# Patient Record
Sex: Female | Born: 1985
Health system: Southern US, Community
[De-identification: ages and names within clinical notes are randomized; demographics above are authoritative.]

## PROBLEM LIST (undated history)

## (undated) ENCOUNTER — Inpatient Hospital Stay (HOSPITAL_COMMUNITY): Admission: RE | Payer: BLUE CROSS/BLUE SHIELD | Source: Ambulatory Visit

## (undated) DIAGNOSIS — E039 Hypothyroidism, unspecified: Secondary | ICD-10-CM

## (undated) DIAGNOSIS — IMO0002 Reserved for concepts with insufficient information to code with codable children: Secondary | ICD-10-CM

## (undated) HISTORY — PX: WISDOM TOOTH EXTRACTION: SHX21

---

## 2010-01-24 ENCOUNTER — Encounter: Admission: RE | Admit: 2010-01-24 | Discharge: 2010-01-24 | Payer: Self-pay | Admitting: Obstetrics and Gynecology

## 2010-01-27 ENCOUNTER — Encounter: Admission: RE | Admit: 2010-01-27 | Discharge: 2010-01-27 | Payer: Self-pay | Admitting: General Surgery

## 2010-02-07 ENCOUNTER — Encounter: Admission: RE | Admit: 2010-02-07 | Discharge: 2010-02-07 | Payer: Self-pay | Admitting: General Surgery

## 2012-05-31 ENCOUNTER — Ambulatory Visit: Payer: Managed Care, Other (non HMO)

## 2012-05-31 ENCOUNTER — Ambulatory Visit (INDEPENDENT_AMBULATORY_CARE_PROVIDER_SITE_OTHER): Payer: Managed Care, Other (non HMO) | Admitting: Family Medicine

## 2012-05-31 VITALS — BP 98/64 | HR 73 | Temp 99.3°F | Resp 20 | Ht 66.0 in | Wt 158.0 lb

## 2012-05-31 DIAGNOSIS — H6090 Unspecified otitis externa, unspecified ear: Secondary | ICD-10-CM

## 2012-05-31 DIAGNOSIS — H9209 Otalgia, unspecified ear: Secondary | ICD-10-CM

## 2012-05-31 DIAGNOSIS — I889 Nonspecific lymphadenitis, unspecified: Secondary | ICD-10-CM

## 2012-05-31 DIAGNOSIS — H60399 Other infective otitis externa, unspecified ear: Secondary | ICD-10-CM

## 2012-05-31 MED ORDER — OFLOXACIN 0.3 % OT SOLN: 5.0000 [drp] | Freq: Every day | OTIC | Status: AC

## 2012-05-31 MED ORDER — AMOXICILLIN 875 MG PO TABS: 875.0000 mg | ORAL_TABLET | Freq: Two times a day (BID) | ORAL | Status: AC

## 2012-05-31 NOTE — Progress Notes (Signed)
Subjective: Patient has been swimming a lot this summer. This morning she awakened with left ear pain. She took some ibuprofen which helped, but it continues to bother her.  Objective: Right ears normal. Left ear canal is almost 2/3 swollen shut. I cannot get a good view of the TM. She has a tender node below the ear. Throat is clear.  Assessment: Otalgia Left otitis externa Lymphadenopathy  Plan: Amoxicillin 875 twice a day Floxin otic Return if further problems

## 2012-05-31 NOTE — Patient Instructions (Signed)
Otitis Externa  Otitis externa ("swimmer's ear") is a germ (bacterial) or fungal infection of the outer ear canal (from the eardrum to the outside of the ear). Swimming in dirty water may cause swimmer's ear. It also may be caused by moisture in the ear from water remaining after swimming or bathing. Often the first signs of infection may be itching in the ear canal. This may progress to ear canal swelling, redness, and pus drainage, which may be signs of infection.  HOME CARE INSTRUCTIONS    Apply the antibiotic drops to the ear canal as prescribed by your doctor.   This can be a very painful medical condition. A strong pain reliever may be prescribed.   Only take over-the-counter or prescription medicines for pain, discomfort, or fever as directed by your caregiver.   If your caregiver has given you a follow-up appointment, it is very important to keep that appointment. Not keeping the appointment could result in a chronic or permanent injury, pain, hearing loss and disability. If there is any problem keeping the appointment, you must call back to this facility for assistance.  PREVENTION    It is important to keep your ear dry. Use the corner of a towel to wick water out of the ear canal after swimming or bathing.   Avoid scratching in your ear. This can damage the ear canal or remove the protective wax lining the canal and make it easier for germs (bacteria) or a fungus to grow.   You may use ear drops made of rubbing alcohol and vinegar after swimming to prevent future "swimmer's ear" infections. Make up a small bottle of equal parts white vinegar and alcohol. Put 3 or 4 drops into each ear after swimming.   Avoid swimming in lakes, polluted water, or poorly chlorinated pools.  SEEK MEDICAL CARE IF:    An oral temperature above 102 F (38.9 C) develops.   Your ear is still painful after 3 days and shows signs of getting worse (redness, swelling, pain, or pus).  MAKE SURE YOU:    Understand these  instructions.   Will watch your condition.   Will get help right away if you are not doing well or get worse.  Document Released: 09/25/2005 Document Revised: 09/14/2011 Document Reviewed: 05/01/2008  [employer] Patient Information 2012 [employer].

## 2012-10-09 NOTE — L&D Delivery Note (Signed)
Operative Delivery Note At 7:07 PM a viable and healthy female was delivered via Vaginal, Spontaneous Delivery.  Presentation: vertex; Position: Left,, Occiput,, Anterior  Delivery of the head: 06/03/2013  7:07 PM  , Suprapubic Pressure McRoberts were performed simultaneously when the anterior shoulder did not immediately deliver  The infants head delivered and restituted to the left. The anterior shoulder did not immediately deliver and McRoberts and Suprapubic pressure were called for and the anterior shoulder released immediately after application of suprapubic pressure.  The Infants body was then eased out and the infant placed on the maternal abdomen. The infant was stimulated and the cord was clamped and cut and the infant was passed to the isolette. The placenta delivered spontaneously, intact, with 3V cord. A right labial laceration was repaired with 3-0 & 4-0 vicryl after infiltration with 1% lidocaine. A 1st degree perineal laceration was repaired with a figure-of-8 stitch. Mother and baby are doing well  APGAR: 6, 8; weight 8 lb 15 oz (4055 g).   Placenta status: Intact, Spontaneous.   Cord: 3 vessels   Anesthesia: Epidural  Episiotomy: None Lacerations:Right Labial & 1st degree vaginal Suture Repair: 3.0 vicryl Est. Blood Loss (mL): 300  Mom to postpartum.  Baby to nursery-stable.  Carla Le. 06/03/2013, 7:37 PM

## 2012-11-01 LAB — OB RESULTS CONSOLE RUBELLA ANTIBODY, IGM: Rubella: IMMUNE

## 2012-11-01 LAB — OB RESULTS CONSOLE HIV ANTIBODY (ROUTINE TESTING): HIV: NONREACTIVE

## 2012-11-01 LAB — OB RESULTS CONSOLE ABO/RH

## 2012-11-01 LAB — OB RESULTS CONSOLE RPR: RPR: NONREACTIVE

## 2012-11-04 LAB — OB RESULTS CONSOLE GC/CHLAMYDIA: Gonorrhea: NEGATIVE

## 2013-06-03 ENCOUNTER — Inpatient Hospital Stay (HOSPITAL_COMMUNITY)
Admission: AD | Admit: 2013-06-03 | Discharge: 2013-06-05 | DRG: 373 | Disposition: A | Discharge status: Home / Self Care | Payer: BC Managed Care – PPO | Source: Ambulatory Visit | Attending: Obstetrics and Gynecology | Admitting: Obstetrics and Gynecology

## 2013-06-03 ENCOUNTER — Encounter (HOSPITAL_COMMUNITY): Payer: Self-pay | Admitting: Anesthesiology

## 2013-06-03 ENCOUNTER — Inpatient Hospital Stay (HOSPITAL_COMMUNITY): Payer: BC Managed Care – PPO | Admitting: Anesthesiology

## 2013-06-03 ENCOUNTER — Encounter (HOSPITAL_COMMUNITY): Payer: Self-pay | Admitting: *Deleted

## 2013-06-03 DIAGNOSIS — E079 Disorder of thyroid, unspecified: Secondary | ICD-10-CM | POA: Diagnosis present

## 2013-06-03 DIAGNOSIS — E039 Hypothyroidism, unspecified: Secondary | ICD-10-CM | POA: Diagnosis present

## 2013-06-03 DIAGNOSIS — O99892 Other specified diseases and conditions complicating childbirth: Secondary | ICD-10-CM | POA: Diagnosis present

## 2013-06-03 DIAGNOSIS — O360990 Maternal care for other rhesus isoimmunization, unspecified trimester, not applicable or unspecified: Secondary | ICD-10-CM | POA: Diagnosis present

## 2013-06-03 DIAGNOSIS — Z22330 Carrier of Group B streptococcus: Secondary | ICD-10-CM

## 2013-06-03 HISTORY — DX: Reserved for concepts with insufficient information to code with codable children: IMO0002

## 2013-06-03 HISTORY — DX: Hypothyroidism, unspecified: E03.9

## 2013-06-03 LAB — CBC
HCT: 37.1 % (ref 36.0–46.0)
Hemoglobin: 13.2 g/dL (ref 12.0–15.0)
MCH: 31.7 pg (ref 26.0–34.0)
MCHC: 35.6 g/dL (ref 30.0–36.0)
MCV: 89.2 fL (ref 78.0–100.0)
Platelets: 186 K/uL (ref 150–400)
RBC: 4.16 MIL/uL (ref 3.87–5.11)
RDW: 13.3 % (ref 11.5–15.5)
WBC: 14.2 K/uL — ABNORMAL HIGH (ref 4.0–10.5)

## 2013-06-03 LAB — SYPHILIS: RPR W/REFLEX TO RPR TITER AND TREPONEMAL ANTIBODIES, TRADITIONAL SCREENING AND DIAGNOSIS ALGORITHM: RPR Ser Ql: NONREACTIVE

## 2013-06-03 MED ORDER — ONDANSETRON HCL 4 MG/2ML IJ SOLN: 4.0000 mg | Freq: Four times a day (QID) | INTRAMUSCULAR | Status: DC | PRN

## 2013-06-03 MED ORDER — PENICILLIN G POTASSIUM 5000000 UNITS IJ SOLR
5.0000 10*6.[IU] | Freq: Once | INTRAMUSCULAR | Status: AC
  Administered 2013-06-03: 5 10*6.[IU] via INTRAVENOUS
  Filled 2013-06-03: qty 5

## 2013-06-03 MED ORDER — LACTATED RINGERS IV SOLN
INTRAVENOUS | Status: DC
  Administered 2013-06-03 (×3): via INTRAVENOUS

## 2013-06-03 MED ORDER — TERBUTALINE SULFATE 1 MG/ML IJ SOLN: 0.2500 mg | Freq: Once | INTRAMUSCULAR | Status: DC | PRN

## 2013-06-03 MED ORDER — OXYCODONE-ACETAMINOPHEN 5-325 MG PO TABS: 1.0000 | ORAL_TABLET | ORAL | Status: DC | PRN

## 2013-06-03 MED ORDER — OXYTOCIN 40 UNITS IN LACTATED RINGERS INFUSION - SIMPLE MED: 62.5000 mL/h | INTRAVENOUS | Status: DC

## 2013-06-03 MED ORDER — OXYTOCIN BOLUS FROM INFUSION: 500.0000 mL | INTRAVENOUS | Status: DC

## 2013-06-03 MED ORDER — METHYLERGONOVINE MALEATE 0.2 MG PO TABS: 0.2000 mg | ORAL_TABLET | ORAL | Status: DC | PRN

## 2013-06-03 MED ORDER — BENZOCAINE-MENTHOL 20-0.5 % EX AERO
1.0000 "application " | INHALATION_SPRAY | CUTANEOUS | Status: DC | PRN
  Administered 2013-06-04: 1 via TOPICAL
  Filled 2013-06-03: qty 56

## 2013-06-03 MED ORDER — SIMETHICONE 80 MG PO CHEW: 80.0000 mg | CHEWABLE_TABLET | ORAL | Status: DC | PRN

## 2013-06-03 MED ORDER — DIPHENHYDRAMINE HCL 25 MG PO CAPS: 25.0000 mg | ORAL_CAPSULE | Freq: Four times a day (QID) | ORAL | Status: DC | PRN

## 2013-06-03 MED ORDER — EPHEDRINE 5 MG/ML INJ
10.0000 mg | INTRAVENOUS | Status: DC | PRN
  Filled 2013-06-03: qty 2

## 2013-06-03 MED ORDER — EPHEDRINE 5 MG/ML INJ
10.0000 mg | INTRAVENOUS | Status: DC | PRN
  Filled 2013-06-03: qty 4
  Filled 2013-06-03: qty 2

## 2013-06-03 MED ORDER — TETANUS-DIPHTH-ACELL PERTUSSIS 5-2.5-18.5 LF-MCG/0.5 IM SUSP
0.5000 mL | Freq: Once | INTRAMUSCULAR | Status: AC
  Administered 2013-06-04: 0.5 mL via INTRAMUSCULAR

## 2013-06-03 MED ORDER — METHYLERGONOVINE MALEATE 0.2 MG/ML IJ SOLN: 0.2000 mg | INTRAMUSCULAR | Status: DC | PRN

## 2013-06-03 MED ORDER — ONDANSETRON HCL 4 MG/2ML IJ SOLN: 4.0000 mg | INTRAMUSCULAR | Status: DC | PRN

## 2013-06-03 MED ORDER — IBUPROFEN 600 MG PO TABS: 600.0000 mg | ORAL_TABLET | Freq: Four times a day (QID) | ORAL | Status: DC | PRN

## 2013-06-03 MED ORDER — PENICILLIN G POTASSIUM 5000000 UNITS IJ SOLR
2.5000 10*6.[IU] | INTRAVENOUS | Status: DC
  Administered 2013-06-03 (×3): 2.5 10*6.[IU] via INTRAVENOUS
  Filled 2013-06-03 (×6): qty 2.5

## 2013-06-03 MED ORDER — DIBUCAINE 1 % RE OINT: 1.0000 "application " | TOPICAL_OINTMENT | RECTAL | Status: DC | PRN

## 2013-06-03 MED ORDER — SENNOSIDES-DOCUSATE SODIUM 8.6-50 MG PO TABS
2.0000 | ORAL_TABLET | Freq: Every day | ORAL | Status: DC
  Administered 2013-06-03 – 2013-06-04 (×2): 2 via ORAL

## 2013-06-03 MED ORDER — LEVOTHYROXINE SODIUM 75 MCG PO TABS
75.0000 ug | ORAL_TABLET | Freq: Every day | ORAL | Status: DC
  Administered 2013-06-04 – 2013-06-05 (×2): 75 ug via ORAL
  Filled 2013-06-03 (×4): qty 1

## 2013-06-03 MED ORDER — ACETAMINOPHEN 325 MG PO TABS: 650.0000 mg | ORAL_TABLET | ORAL | Status: DC | PRN

## 2013-06-03 MED ORDER — LANOLIN HYDROUS EX OINT: TOPICAL_OINTMENT | CUTANEOUS | Status: DC | PRN

## 2013-06-03 MED ORDER — LEVOTHYROXINE SODIUM 25 MCG PO TABS
25.0000 ug | ORAL_TABLET | Freq: Every day | ORAL | Status: DC
  Filled 2013-06-03: qty 1

## 2013-06-03 MED ORDER — DIPHENHYDRAMINE HCL 50 MG/ML IJ SOLN: 12.5000 mg | INTRAMUSCULAR | Status: DC | PRN

## 2013-06-03 MED ORDER — PHENYLEPHRINE 40 MCG/ML (10ML) SYRINGE FOR IV PUSH (FOR BLOOD PRESSURE SUPPORT)
80.0000 ug | PREFILLED_SYRINGE | INTRAVENOUS | Status: DC | PRN
  Filled 2013-06-03: qty 2
  Filled 2013-06-03: qty 5

## 2013-06-03 MED ORDER — LACTATED RINGERS IV SOLN: 500.0000 mL | INTRAVENOUS | Status: DC | PRN

## 2013-06-03 MED ORDER — ONDANSETRON HCL 4 MG PO TABS: 4.0000 mg | ORAL_TABLET | ORAL | Status: DC | PRN

## 2013-06-03 MED ORDER — FENTANYL 2.5 MCG/ML BUPIVACAINE 1/10 % EPIDURAL INFUSION (WH - ANES): INTRAMUSCULAR | Status: DC | PRN

## 2013-06-03 MED ORDER — FENTANYL 2.5 MCG/ML BUPIVACAINE 1/10 % EPIDURAL INFUSION (WH - ANES)
14.0000 mL/h | INTRAMUSCULAR | Status: DC | PRN
  Administered 2013-06-03: 14 mL/h via EPIDURAL
  Filled 2013-06-03 (×2): qty 125

## 2013-06-03 MED ORDER — FLEET ENEMA 7-19 GM/118ML RE ENEM: 1.0000 | ENEMA | RECTAL | Status: DC | PRN

## 2013-06-03 MED ORDER — FENTANYL 2.5 MCG/ML BUPIVACAINE 1/10 % EPIDURAL INFUSION (WH - ANES)
INTRAMUSCULAR | Status: DC | PRN
  Administered 2013-06-03: 14 mL/h via EPIDURAL

## 2013-06-03 MED ORDER — OXYTOCIN 40 UNITS IN LACTATED RINGERS INFUSION - SIMPLE MED
1.0000 m[IU]/min | INTRAVENOUS | Status: DC
  Administered 2013-06-03: 2 m[IU]/min via INTRAVENOUS
  Filled 2013-06-03: qty 1000

## 2013-06-03 MED ORDER — PHENYLEPHRINE 40 MCG/ML (10ML) SYRINGE FOR IV PUSH (FOR BLOOD PRESSURE SUPPORT)
80.0000 ug | PREFILLED_SYRINGE | INTRAVENOUS | Status: DC | PRN
  Filled 2013-06-03: qty 2

## 2013-06-03 MED ORDER — LIDOCAINE HCL (PF) 1 % IJ SOLN
INTRAMUSCULAR | Status: DC | PRN
  Administered 2013-06-03 (×2): 9 mL

## 2013-06-03 MED ORDER — LIDOCAINE HCL (PF) 1 % IJ SOLN
30.0000 mL | INTRAMUSCULAR | Status: DC | PRN
  Administered 2013-06-03: 30 mL via SUBCUTANEOUS
  Filled 2013-06-03 (×2): qty 30

## 2013-06-03 MED ORDER — WITCH HAZEL-GLYCERIN EX PADS: 1.0000 "application " | MEDICATED_PAD | CUTANEOUS | Status: DC | PRN

## 2013-06-03 MED ORDER — ZOLPIDEM TARTRATE 5 MG PO TABS: 5.0000 mg | ORAL_TABLET | Freq: Every evening | ORAL | Status: DC | PRN

## 2013-06-03 MED ORDER — PRENATAL MULTIVITAMIN CH
1.0000 | ORAL_TABLET | Freq: Every day | ORAL | Status: DC
  Administered 2013-06-04 – 2013-06-05 (×2): 1 via ORAL
  Filled 2013-06-03 (×2): qty 1

## 2013-06-03 MED ORDER — CITRIC ACID-SODIUM CITRATE 334-500 MG/5ML PO SOLN: 30.0000 mL | ORAL | Status: DC | PRN

## 2013-06-03 MED ORDER — IBUPROFEN 600 MG PO TABS
600.0000 mg | ORAL_TABLET | Freq: Four times a day (QID) | ORAL | Status: DC
  Administered 2013-06-04 – 2013-06-05 (×7): 600 mg via ORAL
  Filled 2013-06-03 (×7): qty 1

## 2013-06-03 MED ORDER — LACTATED RINGERS IV SOLN: 500.0000 mL | Freq: Once | INTRAVENOUS | Status: DC

## 2013-06-03 NOTE — Anesthesia Procedure Notes (Signed)
Epidural Patient location during procedure: OB Start time: 06/03/2013 9:10 AM End time: 06/03/2013 9:14 AM  Staffing Anesthesiologist: Sandrea Hughs Performed by: anesthesiologist   Preanesthetic Checklist Completed: patient identified, surgical consent, pre-op evaluation, timeout performed, IV checked, risks and benefits discussed and monitors and equipment checked  Epidural Patient position: sitting Prep: site prepped and draped and DuraPrep Patient monitoring: continuous pulse ox and blood pressure Approach: midline Injection technique: LOR air  Needle:  Needle type: Tuohy  Needle gauge: 17 G Needle length: 9 cm and 9 Needle insertion depth: 6 cm Catheter type: closed end flexible Catheter size: 19 Gauge Catheter at skin depth: 11 cm Test dose: negative and Other  Assessment Sensory level: T9 Events: blood not aspirated, injection not painful, no injection resistance, negative IV test and no paresthesia  Additional Notes Reason for block:procedure for pain

## 2013-06-03 NOTE — Anesthesia Preprocedure Evaluation (Signed)
Anesthesia Evaluation  Patient identified by MRN, date of birth, ID band Patient awake    Reviewed: Allergy & Precautions, H&P , NPO status , Patient's Chart, lab work & pertinent test results  Airway Mallampati: I TM Distance: >3 FB Neck ROM: full    Dental no notable dental hx.    Pulmonary neg pulmonary ROS,    Pulmonary exam normal       Cardiovascular negative cardio ROS      Neuro/Psych negative neurological ROS  negative psych ROS   GI/Hepatic negative GI ROS, Neg liver ROS,   Endo/Other    Renal/GU negative Renal ROS  negative genitourinary   Musculoskeletal negative musculoskeletal ROS (+)   Abdominal Normal abdominal exam  (+)   Peds  Hematology negative hematology ROS (+)   Anesthesia Other Findings   Reproductive/Obstetrics (+) Pregnancy                           Anesthesia Physical Anesthesia Plan  ASA: II  Anesthesia Plan: Epidural   Post-op Pain Management:    Induction:   Airway Management Planned:   Additional Equipment:   Intra-op Plan:   Post-operative Plan:   Informed Consent: I have reviewed the patients History and Physical, chart, labs and discussed the procedure including the risks, benefits and alternatives for the proposed anesthesia with the patient or authorized representative who has indicated his/her understanding and acceptance.     Plan Discussed with:   Anesthesia Plan Comments:         Anesthesia Quick Evaluation

## 2013-06-03 NOTE — H&P (Signed)
Carla Le is a 27 y.o. female presenting for labor  27yo G2P0010 @ 39+1 presents c/o contractions and was admitted for labor. Her pregnancy has been uncomplicated to this point. She is Rh negative and received Rhogam @ 28 weeks. She is GBS +. She has a h/o hypothyroidism and is on low dose synthroid History OB History   Grav Para Term Preterm Abortions TAB SAB Ect Mult Living   2    1  1         Past Medical History  Diagnosis Date  . Hypothyroidism   . Abnormal Pap smear    Past Surgical History  Procedure Laterality Date  . Wisdom tooth extraction     Family History: family history is not on file. Social History:  reports that she has never smoked. She has never used smokeless tobacco. She reports that she does not drink alcohol or use illicit drugs.   Prenatal Transfer Tool  Maternal Diabetes: No Genetic Screening: Declined Maternal Ultrasounds/Referrals: Normal Fetal Ultrasounds or other Referrals:  None Maternal Substance Abuse:  No Significant Maternal Medications:  Meds include: Syntroid Significant Maternal Lab Results:  None Other Comments:  None  ROS: as above  Dilation: 10 Effacement (%): 100 Station: +1 Exam by:: k fields, rn Blood pressure 120/73, pulse 113, temperature 99.6 F (37.6 C), temperature source Axillary, resp. rate 20, height 5\' 6"  (1.676 m), weight 88.905 kg (196 lb), SpO2 99.00%. Exam Physical Exam  Prenatal labs: ABO, Rh: A/Negative/-- (01/24 0000) Antibody: Negative (01/24 0000) Rubella: Immune (01/24 0000) RPR: NON REACTIVE (08/26 0329)  HBsAg: Negative (01/24 0000)  HIV: Non-reactive (01/24 0000)  GBS:   Positive  Assessment/Plan: 1) Admit 2) PCN 3) Epidural on request   Wilena Tyndall H. 06/03/2013, 7:54 PM

## 2013-06-03 NOTE — Progress Notes (Signed)
Desires Circ

## 2013-06-03 NOTE — MAU Note (Signed)
Contractions x 3 days, worsening over the last 2 hours.

## 2013-06-04 LAB — CBC
HCT: 31.5 % — ABNORMAL LOW (ref 36.0–46.0)
Hemoglobin: 11 g/dL — ABNORMAL LOW (ref 12.0–15.0)
MCH: 31.7 pg (ref 26.0–34.0)
MCHC: 34.9 g/dL (ref 30.0–36.0)
RBC: 3.47 MIL/uL — ABNORMAL LOW (ref 3.87–5.11)

## 2013-06-04 NOTE — Progress Notes (Signed)
PPD#1 Pt without complaints. Lochia- mild.  VSSAF,  WBC- 24.3 IMP/ Stable Plan/ Routine care. circ today.

## 2013-06-04 NOTE — Anesthesia Postprocedure Evaluation (Signed)
Anesthesia Post Note  Patient: Carla Le  Procedure(s) Performed: * No procedures listed *  Anesthesia type: Epidural  Patient location: Mother/Baby  Post pain: Pain level controlled  Post assessment: Post-op Vital signs reviewed  Last Vitals:  Filed Vitals:   06/04/13 1748  BP: 88/56  Pulse: 81  Temp: 36.6 C  Resp:     Post vital signs: Reviewed  Level of consciousness: awake  Complications: No apparent anesthesia complications

## 2013-06-05 NOTE — Discharge Summary (Signed)
Obstetric Discharge Summary Reason for Admission: onset of labor Prenatal Procedures: none Intrapartum Procedures: spontaneous vaginal delivery Postpartum Procedures: none Complications-Operative and Postpartum: first degree labial tears Hemoglobin  Date Value Range Status  06/04/2013 11.0* 12.0 - 15.0 g/dL Final     HCT  Date Value Range Status  06/04/2013 31.5* 36.0 - 46.0 % Final    Discharge Diagnoses: Term Pregnancy-delivered  Discharge Information: Date: 06/05/2013 Activity: pelvic rest Diet: routine Medications: Ibuprofen Condition: stable Instructions: refer to practice specific booklet Discharge to: home Follow-up Information   Follow up with Almon Hercules., MD In 4 weeks.   Specialty:  Obstetrics and Gynecology   Contact information:   [address] [place] [address] [phone]       Newborn Data: Live born female  Birth Weight: 8 lb 15 oz (4055 g) APGAR: 6, 8  Home with mother.  Edwards Mckelvie A 06/05/2013, 7:04 AM

## 2013-06-05 NOTE — Progress Notes (Signed)
Baby reportedly has a broken clavicle and has had high respirations because of it.  Circ not being done in hospital.

## 2013-06-05 NOTE — Progress Notes (Signed)
Patient is eating, ambulating, voiding.  Pain control is good.  Filed Vitals:   06/04/13 0245 06/04/13 0555 06/04/13 1748 06/05/13 0529  BP: 103/67 109/74 88/56 101/60  Pulse: 111 81 81 74  Temp:  97.6 F (36.4 C) 97.9 F (36.6 C) 97.4 F (36.3 C)  TempSrc: Oral Oral Oral Oral  Resp: 18 18  17   Height:      Weight:      SpO2:  97% 99%     Fundus firm Perineum without swelling.  Lab Results  Component Value Date   WBC 23.4* 06/04/2013   HGB 11.0* 06/04/2013   HCT 31.5* 06/04/2013   MCV 90.8 06/04/2013   PLT 178 06/04/2013    A/Negative/-- (01/24 0000)/RI  A/P Post partum day 2.  Routine care.  Expect d/c today.  Baby Rh negative- no Rhogam.  Kyisha Fowle A

## 2014-08-10 ENCOUNTER — Encounter (HOSPITAL_COMMUNITY): Payer: Self-pay | Admitting: *Deleted

## 2015-06-06 ENCOUNTER — Encounter (HOSPITAL_BASED_OUTPATIENT_CLINIC_OR_DEPARTMENT_OTHER): Payer: Self-pay | Admitting: *Deleted

## 2015-06-06 ENCOUNTER — Emergency Department (HOSPITAL_BASED_OUTPATIENT_CLINIC_OR_DEPARTMENT_OTHER)
Admission: EM | Admit: 2015-06-06 | Discharge: 2015-06-06 | Disposition: A | Discharge status: Home / Self Care | Payer: BLUE CROSS/BLUE SHIELD | Attending: Emergency Medicine | Admitting: Emergency Medicine

## 2015-06-06 ENCOUNTER — Emergency Department (HOSPITAL_BASED_OUTPATIENT_CLINIC_OR_DEPARTMENT_OTHER): Payer: BLUE CROSS/BLUE SHIELD

## 2015-06-06 DIAGNOSIS — Z79899 Other long term (current) drug therapy: Secondary | ICD-10-CM | POA: Diagnosis not present

## 2015-06-06 DIAGNOSIS — R509 Fever, unspecified: Secondary | ICD-10-CM | POA: Insufficient documentation

## 2015-06-06 DIAGNOSIS — E039 Hypothyroidism, unspecified: Secondary | ICD-10-CM | POA: Diagnosis not present

## 2015-06-06 DIAGNOSIS — R109 Unspecified abdominal pain: Secondary | ICD-10-CM | POA: Diagnosis not present

## 2015-06-06 DIAGNOSIS — Z3202 Encounter for pregnancy test, result negative: Secondary | ICD-10-CM | POA: Insufficient documentation

## 2015-06-06 DIAGNOSIS — M546 Pain in thoracic spine: Secondary | ICD-10-CM | POA: Diagnosis present

## 2015-06-06 LAB — CBC WITH DIFFERENTIAL/PLATELET
Basophils Absolute: 0 10*3/uL (ref 0.0–0.1)
Basophils Relative: 0 % (ref 0–1)
EOS PCT: 0 % (ref 0–5)
Eosinophils Absolute: 0 10*3/uL (ref 0.0–0.7)
HCT: 37.7 % (ref 36.0–46.0)
Hemoglobin: 13 g/dL (ref 12.0–15.0)
LYMPHS ABS: 0.7 10*3/uL (ref 0.7–4.0)
LYMPHS PCT: 6 % — AB (ref 12–46)
MCH: 30.7 pg (ref 26.0–34.0)
MCHC: 34.5 g/dL (ref 30.0–36.0)
MCV: 89.1 fL (ref 78.0–100.0)
MONO ABS: 1.6 10*3/uL — AB (ref 0.1–1.0)
Monocytes Relative: 13 % — ABNORMAL HIGH (ref 3–12)
Neutro Abs: 10.2 10*3/uL — ABNORMAL HIGH (ref 1.7–7.7)
Neutrophils Relative %: 81 % — ABNORMAL HIGH (ref 43–77)
PLATELETS: 185 10*3/uL (ref 150–400)
RBC: 4.23 MIL/uL (ref 3.87–5.11)
RDW: 12.6 % (ref 11.5–15.5)
WBC: 12.5 10*3/uL — AB (ref 4.0–10.5)

## 2015-06-06 LAB — COMPREHENSIVE METABOLIC PANEL
ALT: 10 U/L — AB (ref 14–54)
AST: 16 U/L (ref 15–41)
Albumin: 3.6 g/dL (ref 3.5–5.0)
Alkaline Phosphatase: 46 U/L (ref 38–126)
Anion gap: 8 (ref 5–15)
BILIRUBIN TOTAL: 0.7 mg/dL (ref 0.3–1.2)
BUN: 10 mg/dL (ref 6–20)
CALCIUM: 8.7 mg/dL — AB (ref 8.9–10.3)
CHLORIDE: 105 mmol/L (ref 101–111)
CO2: 23 mmol/L (ref 22–32)
CREATININE: 0.81 mg/dL (ref 0.44–1.00)
Glucose, Bld: 109 mg/dL — ABNORMAL HIGH (ref 65–99)
Potassium: 3.4 mmol/L — ABNORMAL LOW (ref 3.5–5.1)
Sodium: 136 mmol/L (ref 135–145)
TOTAL PROTEIN: 6.7 g/dL (ref 6.5–8.1)

## 2015-06-06 LAB — URINALYSIS, ROUTINE W REFLEX MICROSCOPIC
Bilirubin Urine: NEGATIVE
GLUCOSE, UA: NEGATIVE mg/dL
HGB URINE DIPSTICK: NEGATIVE
Ketones, ur: NEGATIVE mg/dL
Leukocytes, UA: NEGATIVE
Nitrite: NEGATIVE
Protein, ur: NEGATIVE mg/dL
SPECIFIC GRAVITY, URINE: 1.012 (ref 1.005–1.030)
Urobilinogen, UA: 1 mg/dL (ref 0.0–1.0)
pH: 7 (ref 5.0–8.0)

## 2015-06-06 LAB — LIPASE, BLOOD: LIPASE: 17 U/L — AB (ref 22–51)

## 2015-06-06 LAB — PREGNANCY, URINE: Preg Test, Ur: NEGATIVE

## 2015-06-06 MED ORDER — FENTANYL CITRATE (PF) 100 MCG/2ML IJ SOLN
100.0000 ug | Freq: Once | INTRAMUSCULAR | Status: AC
  Administered 2015-06-06: 100 ug via INTRAVENOUS
  Filled 2015-06-06: qty 2

## 2015-06-06 MED ORDER — IOHEXOL 300 MG/ML  SOLN
100.0000 mL | Freq: Once | INTRAMUSCULAR | Status: AC | PRN
  Administered 2015-06-06: 100 mL via INTRAVENOUS

## 2015-06-06 MED ORDER — SODIUM CHLORIDE 0.9 % IV SOLN
INTRAVENOUS | Status: DC
  Administered 2015-06-06: 02:00:00 via INTRAVENOUS

## 2015-06-06 MED ORDER — IOHEXOL 300 MG/ML  SOLN
25.0000 mL | Freq: Once | INTRAMUSCULAR | Status: AC | PRN
  Administered 2015-06-06: 25 mL via ORAL

## 2015-06-06 MED ORDER — ONDANSETRON HCL 4 MG/2ML IJ SOLN
4.0000 mg | Freq: Once | INTRAMUSCULAR | Status: AC
  Administered 2015-06-06: 4 mg via INTRAVENOUS
  Filled 2015-06-06: qty 2

## 2015-06-06 MED ORDER — ACETAMINOPHEN 325 MG PO TABS
650.0000 mg | ORAL_TABLET | Freq: Once | ORAL | Status: AC
  Administered 2015-06-06: 650 mg via ORAL
  Filled 2015-06-06: qty 2

## 2015-06-06 MED ORDER — ONDANSETRON 8 MG PO TBDP: 8.0000 mg | ORAL_TABLET | Freq: Three times a day (TID) | ORAL | Status: DC | PRN

## 2015-06-06 MED ORDER — HYDROCODONE-ACETAMINOPHEN 5-325 MG PO TABS: 1.0000 | ORAL_TABLET | Freq: Four times a day (QID) | ORAL | Status: DC | PRN

## 2015-06-06 NOTE — ED Notes (Signed)
Pt remains in CT

## 2015-06-06 NOTE — ED Notes (Signed)
C/o back pain, fever and nausea, also mentions some lightheadedness and chills. Onset Friday am, gradually progressively worse. No relief with advil at 1730. Describes as diffuse bilateral lower back pain, some bilateral radiation around the sides, maybe R>L. (denies: hematuria, dysuria, vomiting, constipation or diarrhea), last BM Saturday am 8/27 (loose), LMP 8/7. No GU MD. Has a PCP ([facility] alert, NAD< calm, interactive, steady gait.

## 2015-06-06 NOTE — ED Provider Notes (Signed)
CSN: [id number]     Arrival date & time 06/06/15  0032 History   First MD Initiated Contact with Patient 06/06/15 0100     Chief Complaint  Patient presents with  . Back Pain     (Consider location/radiation/quality/duration/timing/severity/associated sxs/prior Treatment) HPI  This is a 29 year old female with a two-day history of back pain. The pain is located across her mid back most prominent in the right flank. She developed a fever yesterday which she treated with ibuprofen. Her pain worsened and became severe and not responsive to ibuprofen. On arrival here she was noted to have a temperature of 102.6. She has had nausea but no vomited. She has had some loose stools but no frank diarrhea. She has a headache but no stiff neck. She denies constipation, hematuria, dysuria or vomiting. She feels like her back is stiff but pain is not worse with movement.  Past Medical History  Diagnosis Date  . Hypothyroidism   . Abnormal Pap smear    Past Surgical History  Procedure Laterality Date  . Wisdom tooth extraction     History reviewed. No pertinent family history. Social History  Substance Use Topics  . Smoking status: Never Smoker   . Smokeless tobacco: Never Used  . Alcohol Use: No   OB History    Gravida Para Term Preterm AB TAB SAB Ectopic Multiple Living   Review of Systems  All other systems reviewed and are negative.   Allergies  Sulfa antibiotics  Home Medications   Prior to Admission medications   Medication Sig Start Date End Date Taking? Authorizing Provider  levothyroxine (SYNTHROID, LEVOTHROID) 25 MCG tablet Take 25 mcg by mouth daily.    Historical Provider, MD  Prenatal Vit-Fe Fumarate-FA (PRENATAL MULTIVITAMIN) TABS tablet Take 1 tablet by mouth daily at 12 noon.    Historical Provider, MD   BP 98/58 mmHg  Pulse 111  Temp(Src) 98.5 F (36.9 C) (Oral)  Resp 18  Ht  (1.676 m)  Wt 165 lb (74.844 kg)  BMI 26.64 kg/m2  SpO2 99%   LMP 05/16/2015 (Exact Date)   Physical Exam  General: Well-developed, well-nourished female in no acute distress; appearance consistent with age of record HENT: normocephalic; atraumatic Eyes: pupils equal, round and reactive to light; extraocular muscles intact Neck: supple; no meningeal signs Heart: regular rate and rhythm; tachycardia Lungs: clear to auscultation bilaterally Abdomen: soft; nondistended; mild left lower quadrant and right lower quadrant tenderness; no masses or hepatosplenomegaly; bowel sounds present GU: Bilateral CVA tenderness, most prominent on the right Extremities: No deformity; full range of motion; pulses normal Neurologic: Awake, alert and oriented; motor function intact in all extremities and symmetric; no facial droop Skin: Warm and dry Psychiatric: Normal mood and affect    ED Course  Procedures (including critical care time)   MDM   Nursing notes and vitals signs, including pulse oximetry, reviewed.  Summary of this visit's results, reviewed by myself:  Labs:  Results for orders placed or performed during the hospital encounter of 06/06/15 (from the past 24 hour(s))  Pregnancy, urine     Status: None   Collection Time: 06/06/15 12:48 AM  Result Value Ref Range   Preg Test, Ur NEGATIVE NEGATIVE  Urinalysis, Routine w reflex microscopic (not at [facility])     Status: None   Collection Time: 06/06/15 12:48 AM  Result Value Ref Range   Color, Urine YELLOW YELLOW  APPearance CLEAR CLEAR   Specific Gravity, Urine 1.012 1.005 - 1.030   pH 7.0 5.0 - 8.0   Glucose, UA NEGATIVE NEGATIVE mg/dL   Hgb urine dipstick NEGATIVE NEGATIVE   Bilirubin Urine NEGATIVE NEGATIVE   Ketones, ur NEGATIVE NEGATIVE mg/dL   Protein, ur NEGATIVE NEGATIVE mg/dL   Urobilinogen, UA 1.0 0.0 - 1.0 mg/dL   Nitrite NEGATIVE NEGATIVE   Leukocytes, UA NEGATIVE NEGATIVE  CBC with Differential/Platelet     Status: Abnormal   Collection Time: 06/06/15  1:40 AM  Result Value Ref  Range   WBC 12.5 (H) 4.0 - 10.5 K/uL   RBC 4.23 3.87 - 5.11 MIL/uL   Hemoglobin 13.0 12.0 - 15.0 g/dL   HCT 16.1 09.6 - 04.5 %   MCV 89.1 78.0 - 100.0 fL   MCH 30.7 26.0 - 34.0 pg   MCHC 34.5 30.0 - 36.0 g/dL   RDW 40.9 81.1 - 91.4 %   Platelets 185 150 - 400 K/uL   Neutrophils Relative % 81 (H) 43 - 77 %   Neutro Abs 10.2 (H) 1.7 - 7.7 K/uL   Lymphocytes Relative 6 (L) 12 - 46 %   Lymphs Abs 0.7 0.7 - 4.0 K/uL   Monocytes Relative 13 (H) 3 - 12 %   Monocytes Absolute 1.6 (H) 0.1 - 1.0 K/uL   Eosinophils Relative 0 0 - 5 %   Eosinophils Absolute 0.0 0.0 - 0.7 K/uL   Basophils Relative 0 0 - 1 %   Basophils Absolute 0.0 0.0 - 0.1 K/uL  Comprehensive metabolic panel     Status: Abnormal   Collection Time: 06/06/15  1:40 AM  Result Value Ref Range   Sodium 136 135 - 145 mmol/L   Potassium 3.4 (L) 3.5 - 5.1 mmol/L   Chloride 105 101 - 111 mmol/L   CO2 23 22 - 32 mmol/L   Glucose, Bld 109 (H) 65 - 99 mg/dL   BUN 10 6 - 20 mg/dL   Creatinine, Ser 7.82 0.44 - 1.00 mg/dL   Calcium 8.7 (L) 8.9 - 10.3 mg/dL   Total Protein 6.7 6.5 - 8.1 g/dL   Albumin 3.6 3.5 - 5.0 g/dL   AST 16 15 - 41 U/L   ALT 10 (L) 14 - 54 U/L   Alkaline Phosphatase 46 38 - 126 U/L   Total Bilirubin 0.7 0.3 - 1.2 mg/dL   GFR calc non Af Amer >60 >60 mL/min   GFR calc Af Amer >60 >60 mL/min   Anion gap 8 5 - 15  Lipase, blood     Status: Abnormal   Collection Time: 06/06/15  1:40 AM  Result Value Ref Range   Lipase 17 (L) 22 - 51 U/L    Imaging Studies: Ct Abdomen Pelvis W Contrast  06/06/2015   CLINICAL DATA:  Bilateral flank pain, right greater than left. Right hip pain for 3 days. Fever and nausea.  EXAM: CT ABDOMEN AND PELVIS WITH CONTRAST  TECHNIQUE: Multidetector CT imaging of the abdomen and pelvis was performed using the standard protocol following bolus administration of intravenous contrast.  CONTRAST:  OMNIPAQUE IOHEXOL 300 MG/ML SOLN, 25mL OMNIPAQUE IOHEXOL 300 MG/ML SOLN  COMPARISON:   None.  FINDINGS: Lower chest:  The included lung bases are clear.  Liver: Normal in size, homogeneous in density without focal lesion.  Hepatobiliary: Gallbladder physiologically distended, no calcified gallstone. No biliary dilatation.  Pancreas: Normal.  Spleen: Normal.  Adrenal glands: No nodule.  Kidneys: Symmetric renal enhancement.  No hydronephrosis. No perinephric stranding or localizing abnormality.  Stomach/Bowel: Stomach physiologically distended. There are no dilated or thickened small bowel loops. Small volume of stool throughout the colon without colonic wall thickening. The appendix is normal.  Vascular/Lymphatic: No retroperitoneal adenopathy. Abdominal aorta is normal in caliber.  Reproductive: Intrauterine device appropriately positioned in the endometrium. Follicular 2.3 cm cyst in the right ovary. Left ovary is normal in size. No adnexal mass. There is a 2 cm cyst in the region of the left vagina or low cervix, may reflect a Bartholin's or Skene's duct cyst.  Bladder: Physiologically distended.  Other: No free air, free fluid, or intra-abdominal fluid collection.  Musculoskeletal: There are no acute or suspicious osseous abnormalities.  IMPRESSION: No acute abnormality in the abdomen/pelvis.   Electronically Signed   By: Rubye Oaks M.D.   On: 06/06/2015 03:37   3:46 AM Patient states that she has a long-standing history of the cyst seen in the region of the vagina or cervix and this is asymptomatic. She was advised of CT findings which showed no acute etiology for her pain. The CBC is consistent with a viral illness which is likely the cause of her fever. Her back pain is likely musculoskeletal or radicular.      Paula Libra, MD 06/06/15 [phone]

## 2016-05-23 DIAGNOSIS — E039 Hypothyroidism, unspecified: Secondary | ICD-10-CM | POA: Diagnosis not present

## 2016-05-30 DIAGNOSIS — E039 Hypothyroidism, unspecified: Secondary | ICD-10-CM | POA: Diagnosis not present

## 2016-10-17 DIAGNOSIS — H5211 Myopia, right eye: Secondary | ICD-10-CM | POA: Diagnosis not present

## 2016-11-03 DIAGNOSIS — Z30432 Encounter for removal of intrauterine contraceptive device: Secondary | ICD-10-CM | POA: Diagnosis not present

## 2016-11-03 DIAGNOSIS — N760 Acute vaginitis: Secondary | ICD-10-CM | POA: Diagnosis not present

## 2016-11-03 DIAGNOSIS — Z6826 Body mass index (BMI) 26.0-26.9, adult: Secondary | ICD-10-CM | POA: Diagnosis not present

## 2016-12-27 DIAGNOSIS — Z124 Encounter for screening for malignant neoplasm of cervix: Secondary | ICD-10-CM | POA: Diagnosis not present

## 2016-12-27 DIAGNOSIS — Z01419 Encounter for gynecological examination (general) (routine) without abnormal findings: Secondary | ICD-10-CM | POA: Diagnosis not present

## 2016-12-27 DIAGNOSIS — Z6825 Body mass index (BMI) 25.0-25.9, adult: Secondary | ICD-10-CM | POA: Diagnosis not present

## 2017-05-24 DIAGNOSIS — E039 Hypothyroidism, unspecified: Secondary | ICD-10-CM | POA: Diagnosis not present

## 2017-05-31 DIAGNOSIS — E039 Hypothyroidism, unspecified: Secondary | ICD-10-CM | POA: Diagnosis not present

## 2017-06-05 DIAGNOSIS — E039 Hypothyroidism, unspecified: Secondary | ICD-10-CM | POA: Diagnosis not present

## 2017-06-14 DIAGNOSIS — Z0189 Encounter for other specified special examinations: Secondary | ICD-10-CM | POA: Diagnosis not present

## 2017-06-14 DIAGNOSIS — Z6825 Body mass index (BMI) 25.0-25.9, adult: Secondary | ICD-10-CM | POA: Diagnosis not present

## 2017-06-14 DIAGNOSIS — Z1389 Encounter for screening for other disorder: Secondary | ICD-10-CM | POA: Diagnosis not present

## 2017-06-14 DIAGNOSIS — E039 Hypothyroidism, unspecified: Secondary | ICD-10-CM | POA: Diagnosis not present

## 2017-06-21 DIAGNOSIS — N925 Other specified irregular menstruation: Secondary | ICD-10-CM | POA: Diagnosis not present

## 2017-06-21 DIAGNOSIS — Z3201 Encounter for pregnancy test, result positive: Secondary | ICD-10-CM | POA: Diagnosis not present

## 2017-06-21 DIAGNOSIS — Z13228 Encounter for screening for other metabolic disorders: Secondary | ICD-10-CM | POA: Diagnosis not present

## 2017-06-21 DIAGNOSIS — Z3480 Encounter for supervision of other normal pregnancy, unspecified trimester: Secondary | ICD-10-CM | POA: Diagnosis not present

## 2017-06-21 DIAGNOSIS — Z1329 Encounter for screening for other suspected endocrine disorder: Secondary | ICD-10-CM | POA: Diagnosis not present

## 2017-06-21 DIAGNOSIS — Z130 Encounter for screening for diseases of the blood and blood-forming organs and certain disorders involving the immune mechanism: Secondary | ICD-10-CM | POA: Diagnosis not present

## 2017-06-21 DIAGNOSIS — Z13220 Encounter for screening for lipoid disorders: Secondary | ICD-10-CM | POA: Diagnosis not present

## 2017-07-05 DIAGNOSIS — Z3480 Encounter for supervision of other normal pregnancy, unspecified trimester: Secondary | ICD-10-CM | POA: Diagnosis not present

## 2017-07-05 DIAGNOSIS — E039 Hypothyroidism, unspecified: Secondary | ICD-10-CM | POA: Diagnosis not present

## 2017-07-05 LAB — OB RESULTS CONSOLE ANTIBODY SCREEN: ANTIBODY SCREEN: NEGATIVE

## 2017-07-05 LAB — OB RESULTS CONSOLE HIV ANTIBODY (ROUTINE TESTING): HIV: NONREACTIVE

## 2017-07-05 LAB — OB RESULTS CONSOLE GC/CHLAMYDIA
CHLAMYDIA, DNA PROBE: NEGATIVE
Gonorrhea: NEGATIVE

## 2017-07-05 LAB — OB RESULTS CONSOLE RPR: RPR: NONREACTIVE

## 2017-07-05 LAB — OB RESULTS CONSOLE ABO/RH: RH TYPE: NEGATIVE

## 2017-07-05 LAB — OB RESULTS CONSOLE HEPATITIS B SURFACE ANTIGEN: Hepatitis B Surface Ag: NEGATIVE

## 2017-07-05 LAB — OB RESULTS CONSOLE RUBELLA ANTIBODY, IGM: Rubella: IMMUNE

## 2017-07-10 DIAGNOSIS — J208 Acute bronchitis due to other specified organisms: Secondary | ICD-10-CM | POA: Diagnosis not present

## 2017-07-10 DIAGNOSIS — Z3491 Encounter for supervision of normal pregnancy, unspecified, first trimester: Secondary | ICD-10-CM | POA: Diagnosis not present

## 2017-07-10 DIAGNOSIS — Z6825 Body mass index (BMI) 25.0-25.9, adult: Secondary | ICD-10-CM | POA: Diagnosis not present

## 2017-07-27 DIAGNOSIS — Z23 Encounter for immunization: Secondary | ICD-10-CM | POA: Diagnosis not present

## 2017-08-21 DIAGNOSIS — E039 Hypothyroidism, unspecified: Secondary | ICD-10-CM | POA: Diagnosis not present

## 2017-08-21 DIAGNOSIS — Z363 Encounter for antenatal screening for malformations: Secondary | ICD-10-CM | POA: Diagnosis not present

## 2017-08-27 DIAGNOSIS — Z6826 Body mass index (BMI) 26.0-26.9, adult: Secondary | ICD-10-CM | POA: Diagnosis not present

## 2017-08-27 DIAGNOSIS — J069 Acute upper respiratory infection, unspecified: Secondary | ICD-10-CM | POA: Diagnosis not present

## 2017-09-19 DIAGNOSIS — Z369 Encounter for antenatal screening, unspecified: Secondary | ICD-10-CM | POA: Diagnosis not present

## 2017-09-19 DIAGNOSIS — O43122 Velamentous insertion of umbilical cord, second trimester: Secondary | ICD-10-CM | POA: Diagnosis not present

## 2017-10-01 ENCOUNTER — Other Ambulatory Visit (HOSPITAL_COMMUNITY): Payer: Self-pay | Admitting: Obstetrics and Gynecology

## 2017-10-01 DIAGNOSIS — Z3689 Encounter for other specified antenatal screening: Secondary | ICD-10-CM

## 2017-10-08 ENCOUNTER — Encounter (HOSPITAL_COMMUNITY): Payer: Self-pay | Admitting: Obstetrics and Gynecology

## 2017-10-10 ENCOUNTER — Encounter (HOSPITAL_COMMUNITY): Payer: Self-pay | Admitting: *Deleted

## 2017-10-11 ENCOUNTER — Encounter (HOSPITAL_COMMUNITY): Payer: Self-pay

## 2017-10-11 ENCOUNTER — Ambulatory Visit (HOSPITAL_COMMUNITY)
Admission: RE | Admit: 2017-10-11 | Discharge: 2017-10-11 | Disposition: A | Discharge status: Home / Self Care | Payer: BLUE CROSS/BLUE SHIELD | Source: Ambulatory Visit | Attending: Obstetrics and Gynecology | Admitting: Obstetrics and Gynecology

## 2017-10-11 ENCOUNTER — Other Ambulatory Visit (HOSPITAL_COMMUNITY): Payer: Self-pay | Admitting: Obstetrics and Gynecology

## 2017-10-11 DIAGNOSIS — Z3A24 24 weeks gestation of pregnancy: Secondary | ICD-10-CM

## 2017-10-11 DIAGNOSIS — Z3689 Encounter for other specified antenatal screening: Secondary | ICD-10-CM

## 2017-10-12 ENCOUNTER — Other Ambulatory Visit (HOSPITAL_COMMUNITY): Payer: Self-pay | Admitting: *Deleted

## 2017-10-25 ENCOUNTER — Ambulatory Visit (HOSPITAL_COMMUNITY)
Admission: RE | Admit: 2017-10-25 | Discharge: 2017-10-25 | Disposition: A | Discharge status: Home / Self Care | Payer: BLUE CROSS/BLUE SHIELD | Source: Ambulatory Visit | Attending: Obstetrics and Gynecology | Admitting: Obstetrics and Gynecology

## 2017-10-25 ENCOUNTER — Encounter (HOSPITAL_COMMUNITY): Payer: Self-pay

## 2017-10-25 DIAGNOSIS — Z3480 Encounter for supervision of other normal pregnancy, unspecified trimester: Secondary | ICD-10-CM | POA: Diagnosis not present

## 2017-10-25 DIAGNOSIS — O360990 Maternal care for other rhesus isoimmunization, unspecified trimester, not applicable or unspecified: Secondary | ICD-10-CM | POA: Diagnosis not present

## 2017-10-25 DIAGNOSIS — Z23 Encounter for immunization: Secondary | ICD-10-CM | POA: Diagnosis not present

## 2017-10-25 DIAGNOSIS — Z3A26 26 weeks gestation of pregnancy: Secondary | ICD-10-CM | POA: Insufficient documentation

## 2017-11-08 ENCOUNTER — Encounter (HOSPITAL_COMMUNITY): Payer: Self-pay

## 2017-11-08 ENCOUNTER — Other Ambulatory Visit (HOSPITAL_COMMUNITY): Payer: Self-pay | Admitting: Maternal and Fetal Medicine

## 2017-11-08 ENCOUNTER — Ambulatory Visit (HOSPITAL_COMMUNITY)
Admission: RE | Admit: 2017-11-08 | Discharge: 2017-11-08 | Disposition: A | Discharge status: Home / Self Care | Payer: BLUE CROSS/BLUE SHIELD | Source: Ambulatory Visit | Attending: Obstetrics and Gynecology | Admitting: Obstetrics and Gynecology

## 2017-11-08 DIAGNOSIS — Z362 Encounter for other antenatal screening follow-up: Secondary | ICD-10-CM | POA: Diagnosis not present

## 2017-11-08 DIAGNOSIS — IMO0002 Reserved for concepts with insufficient information to code with codable children: Secondary | ICD-10-CM

## 2017-11-08 DIAGNOSIS — Z0489 Encounter for examination and observation for other specified reasons: Secondary | ICD-10-CM | POA: Diagnosis not present

## 2017-11-08 DIAGNOSIS — Z3A28 28 weeks gestation of pregnancy: Secondary | ICD-10-CM | POA: Insufficient documentation

## 2017-11-09 ENCOUNTER — Other Ambulatory Visit (HOSPITAL_COMMUNITY): Payer: Self-pay | Admitting: *Deleted

## 2017-11-09 DIAGNOSIS — O43103 Malformation of placenta, unspecified, third trimester: Secondary | ICD-10-CM

## 2017-11-12 DIAGNOSIS — R10813 Right lower quadrant abdominal tenderness: Secondary | ICD-10-CM | POA: Diagnosis not present

## 2017-11-12 DIAGNOSIS — Z3A29 29 weeks gestation of pregnancy: Secondary | ICD-10-CM | POA: Diagnosis not present

## 2017-11-12 DIAGNOSIS — R768 Other specified abnormal immunological findings in serum: Secondary | ICD-10-CM | POA: Diagnosis not present

## 2017-11-12 DIAGNOSIS — E039 Hypothyroidism, unspecified: Secondary | ICD-10-CM | POA: Insufficient documentation

## 2017-11-12 DIAGNOSIS — R1030 Lower abdominal pain, unspecified: Secondary | ICD-10-CM | POA: Diagnosis not present

## 2017-11-12 DIAGNOSIS — O9989 Other specified diseases and conditions complicating pregnancy, childbirth and the puerperium: Secondary | ICD-10-CM | POA: Diagnosis not present

## 2017-11-12 DIAGNOSIS — Z882 Allergy status to sulfonamides status: Secondary | ICD-10-CM | POA: Diagnosis not present

## 2017-11-12 DIAGNOSIS — Z331 Pregnant state, incidental: Secondary | ICD-10-CM | POA: Diagnosis not present

## 2017-11-12 DIAGNOSIS — Z041 Encounter for examination and observation following transport accident: Secondary | ICD-10-CM | POA: Diagnosis not present

## 2017-11-12 DIAGNOSIS — O9A213 Injury, poisoning and certain other consequences of external causes complicating pregnancy, third trimester: Secondary | ICD-10-CM | POA: Diagnosis not present

## 2017-11-12 DIAGNOSIS — R40241 Glasgow coma scale score 13-15, unspecified time: Secondary | ICD-10-CM | POA: Diagnosis not present

## 2017-11-12 DIAGNOSIS — O26893 Other specified pregnancy related conditions, third trimester: Secondary | ICD-10-CM | POA: Diagnosis not present

## 2017-11-12 DIAGNOSIS — Z043 Encounter for examination and observation following other accident: Secondary | ICD-10-CM | POA: Diagnosis not present

## 2017-11-12 DIAGNOSIS — O99283 Endocrine, nutritional and metabolic diseases complicating pregnancy, third trimester: Secondary | ICD-10-CM | POA: Diagnosis not present

## 2017-11-12 DIAGNOSIS — R1031 Right lower quadrant pain: Secondary | ICD-10-CM | POA: Diagnosis not present

## 2017-11-22 ENCOUNTER — Encounter (HOSPITAL_COMMUNITY): Payer: Self-pay

## 2017-11-22 ENCOUNTER — Other Ambulatory Visit (HOSPITAL_COMMUNITY): Payer: Self-pay | Admitting: Maternal and Fetal Medicine

## 2017-11-22 ENCOUNTER — Ambulatory Visit (HOSPITAL_COMMUNITY)
Admission: RE | Admit: 2017-11-22 | Discharge: 2017-11-22 | Disposition: A | Discharge status: Home / Self Care | Payer: BLUE CROSS/BLUE SHIELD | Source: Ambulatory Visit | Attending: Obstetrics and Gynecology | Admitting: Obstetrics and Gynecology

## 2017-11-22 DIAGNOSIS — O43103 Malformation of placenta, unspecified, third trimester: Secondary | ICD-10-CM | POA: Diagnosis not present

## 2017-11-22 DIAGNOSIS — Z3A30 30 weeks gestation of pregnancy: Secondary | ICD-10-CM | POA: Insufficient documentation

## 2017-11-23 ENCOUNTER — Other Ambulatory Visit (HOSPITAL_COMMUNITY): Payer: Self-pay | Admitting: *Deleted

## 2017-11-23 DIAGNOSIS — E039 Hypothyroidism, unspecified: Secondary | ICD-10-CM | POA: Diagnosis not present

## 2017-11-23 DIAGNOSIS — O43103 Malformation of placenta, unspecified, third trimester: Secondary | ICD-10-CM

## 2017-12-06 ENCOUNTER — Ambulatory Visit (HOSPITAL_COMMUNITY)
Admission: RE | Admit: 2017-12-06 | Discharge: 2017-12-06 | Disposition: A | Discharge status: Home / Self Care | Payer: BLUE CROSS/BLUE SHIELD | Source: Ambulatory Visit | Attending: Obstetrics and Gynecology | Admitting: Obstetrics and Gynecology

## 2017-12-06 ENCOUNTER — Encounter (HOSPITAL_COMMUNITY): Payer: Self-pay

## 2017-12-06 DIAGNOSIS — O43103 Malformation of placenta, unspecified, third trimester: Secondary | ICD-10-CM | POA: Insufficient documentation

## 2017-12-06 DIAGNOSIS — Z3A32 32 weeks gestation of pregnancy: Secondary | ICD-10-CM | POA: Diagnosis not present

## 2017-12-17 ENCOUNTER — Other Ambulatory Visit: Payer: Self-pay | Admitting: Obstetrics and Gynecology

## 2017-12-19 ENCOUNTER — Encounter (HOSPITAL_COMMUNITY): Payer: Self-pay

## 2017-12-21 DIAGNOSIS — Z369 Encounter for antenatal screening, unspecified: Secondary | ICD-10-CM | POA: Diagnosis not present

## 2017-12-25 ENCOUNTER — Inpatient Hospital Stay (HOSPITAL_COMMUNITY)
Admission: AD | Admit: 2017-12-25 | Discharge: 2017-12-25 | Disposition: A | Discharge status: Home / Self Care | Payer: BLUE CROSS/BLUE SHIELD | Source: Ambulatory Visit | Attending: Obstetrics and Gynecology | Admitting: Obstetrics and Gynecology

## 2017-12-25 ENCOUNTER — Other Ambulatory Visit: Payer: Self-pay | Admitting: Obstetrics and Gynecology

## 2017-12-25 MED ORDER — BETAMETHASONE SOD PHOS & ACET 6 (3-3) MG/ML IJ SUSP
12.0000 mg | Freq: Once | INTRAMUSCULAR | Status: AC
  Administered 2017-12-25: 12 mg via INTRAMUSCULAR
  Filled 2017-12-25: qty 2

## 2017-12-25 NOTE — MAU Note (Signed)
Pt states she is scheduled for a C/S on Thursday and was told to come here today for BMZ injection.

## 2017-12-25 NOTE — Patient Instructions (Signed)
Carla CrockerLauren Le  12/25/2017   Your procedure is scheduled on:  12/27/2017  Enter through the Main Entrance of [facility] at 1000 AM.  Pick up the phone at the desk and dial [phone]  Call this number if you have problems the morning of surgery:[phone]  Remember:   Do not eat food:(After Midnight) Desps de medianoche.  Do not drink clear liquids: (After Midnight) Desps de medianoche.  Take these medicines the morning of surgery with A SIP OF WATER: synthroid   Do not wear jewelry, make-up or nail polish.  Do not wear lotions, powders, or perfumes. Do not wear deodorant.  Do not shave 48 hours prior to surgery.  Do not bring valuables to the hospital.  [facility] is not   responsible for any belongings or valuables brought to the hospital.  Contacts, dentures or bridgework may not be worn into surgery.  Leave suitcase in the car. After surgery it may be brought to your room.  For patients admitted to the hospital, checkout time is 11:00 AM the day of              discharge.    N/A   Please read over the following fact sheets that you were given:   Surgical Site Infection Prevention

## 2017-12-26 ENCOUNTER — Encounter (HOSPITAL_COMMUNITY)
Admission: RE | Admit: 2017-12-26 | Discharge: 2017-12-26 | Disposition: A | Discharge status: Home / Self Care | Payer: BLUE CROSS/BLUE SHIELD | Source: Ambulatory Visit | Attending: Obstetrics and Gynecology | Admitting: Obstetrics and Gynecology

## 2017-12-26 ENCOUNTER — Inpatient Hospital Stay (HOSPITAL_COMMUNITY)
Admission: AD | Admit: 2017-12-26 | Discharge: 2017-12-26 | Disposition: A | Discharge status: Home / Self Care | Payer: BLUE CROSS/BLUE SHIELD | Source: Ambulatory Visit | Attending: Obstetrics and Gynecology | Admitting: Obstetrics and Gynecology

## 2017-12-26 LAB — CBC
HEMATOCRIT: 34.1 % — AB (ref 36.0–46.0)
HEMOGLOBIN: 11.6 g/dL — AB (ref 12.0–15.0)
MCH: 30.5 pg (ref 26.0–34.0)
MCHC: 34 g/dL (ref 30.0–36.0)
MCV: 89.7 fL (ref 78.0–100.0)
PLATELETS: 190 10*3/uL (ref 150–400)
RBC: 3.8 MIL/uL — ABNORMAL LOW (ref 3.87–5.11)
RDW: 13.2 % (ref 11.5–15.5)
WBC: 19.7 10*3/uL — AB (ref 4.0–10.5)

## 2017-12-26 MED ORDER — BETAMETHASONE SOD PHOS & ACET 6 (3-3) MG/ML IJ SUSP
12.0000 mg | Freq: Once | INTRAMUSCULAR | Status: AC
  Administered 2017-12-26: 12 mg via INTRAMUSCULAR
  Filled 2017-12-26: qty 2

## 2017-12-26 NOTE — Progress Notes (Signed)
D/C'd pt. VSS. Doppler 135. Pt on the way to register for schedule c/s tomorrow for vasa previa. 0/10 pain

## 2017-12-27 ENCOUNTER — Inpatient Hospital Stay (HOSPITAL_COMMUNITY): Payer: BLUE CROSS/BLUE SHIELD | Admitting: Anesthesiology

## 2017-12-27 ENCOUNTER — Encounter (HOSPITAL_COMMUNITY)
Admission: AD | Disposition: A | Discharge status: Home / Self Care | Payer: Self-pay | Source: Ambulatory Visit | Attending: Obstetrics and Gynecology

## 2017-12-27 ENCOUNTER — Encounter (HOSPITAL_COMMUNITY): Payer: Self-pay | Admitting: Anesthesiology

## 2017-12-27 ENCOUNTER — Inpatient Hospital Stay (HOSPITAL_COMMUNITY)
Admission: AD | Admit: 2017-12-27 | Discharge: 2017-12-30 | DRG: 786 | Disposition: A | Discharge status: Home / Self Care | Payer: BLUE CROSS/BLUE SHIELD | Source: Ambulatory Visit | Attending: Obstetrics and Gynecology | Admitting: Obstetrics and Gynecology

## 2017-12-27 DIAGNOSIS — Z3A35 35 weeks gestation of pregnancy: Secondary | ICD-10-CM | POA: Diagnosis not present

## 2017-12-27 DIAGNOSIS — Z23 Encounter for immunization: Secondary | ICD-10-CM | POA: Diagnosis not present

## 2017-12-27 DIAGNOSIS — O99284 Endocrine, nutritional and metabolic diseases complicating childbirth: Secondary | ICD-10-CM | POA: Diagnosis not present

## 2017-12-27 DIAGNOSIS — E039 Hypothyroidism, unspecified: Secondary | ICD-10-CM | POA: Diagnosis present

## 2017-12-27 DIAGNOSIS — Z3A00 Weeks of gestation of pregnancy not specified: Secondary | ICD-10-CM | POA: Diagnosis not present

## 2017-12-27 LAB — RPR: RPR Ser Ql: NONREACTIVE

## 2017-12-27 SURGERY — Surgical Case
Anesthesia: Spinal

## 2017-12-27 MED ORDER — ACETAMINOPHEN 325 MG PO TABS: 650.0000 mg | ORAL_TABLET | ORAL | Status: DC | PRN

## 2017-12-27 MED ORDER — PRENATAL MULTIVITAMIN CH
1.0000 | ORAL_TABLET | Freq: Every day | ORAL | Status: DC
  Administered 2017-12-28 – 2017-12-30 (×3): 1 via ORAL
  Filled 2017-12-27 (×3): qty 1

## 2017-12-27 MED ORDER — PHENYLEPHRINE 8 MG IN D5W 100 ML (0.08MG/ML) PREMIX OPTIME
INJECTION | INTRAVENOUS | Status: AC
  Filled 2017-12-27: qty 100

## 2017-12-27 MED ORDER — IBUPROFEN 600 MG PO TABS
600.0000 mg | ORAL_TABLET | Freq: Four times a day (QID) | ORAL | Status: DC
  Administered 2017-12-28 – 2017-12-30 (×11): 600 mg via ORAL
  Filled 2017-12-27 (×11): qty 1

## 2017-12-27 MED ORDER — SIMETHICONE 80 MG PO CHEW
80.0000 mg | CHEWABLE_TABLET | ORAL | Status: DC
  Administered 2017-12-28 – 2017-12-30 (×3): 80 mg via ORAL
  Filled 2017-12-27 (×3): qty 1

## 2017-12-27 MED ORDER — DIPHENHYDRAMINE HCL 25 MG PO CAPS: 25.0000 mg | ORAL_CAPSULE | Freq: Four times a day (QID) | ORAL | Status: DC | PRN

## 2017-12-27 MED ORDER — SIMETHICONE 80 MG PO CHEW
80.0000 mg | CHEWABLE_TABLET | Freq: Three times a day (TID) | ORAL | Status: DC
  Administered 2017-12-28 – 2017-12-30 (×6): 80 mg via ORAL
  Filled 2017-12-27 (×7): qty 1

## 2017-12-27 MED ORDER — LACTATED RINGERS IV SOLN
INTRAVENOUS | Status: DC | PRN
  Administered 2017-12-27: 12:00:00 via INTRAVENOUS

## 2017-12-27 MED ORDER — MEPERIDINE HCL 25 MG/ML IJ SOLN: 6.2500 mg | INTRAMUSCULAR | Status: DC | PRN

## 2017-12-27 MED ORDER — PROMETHAZINE HCL 25 MG/ML IJ SOLN: 6.2500 mg | INTRAMUSCULAR | Status: DC | PRN

## 2017-12-27 MED ORDER — SIMETHICONE 80 MG PO CHEW: 80.0000 mg | CHEWABLE_TABLET | ORAL | Status: DC | PRN

## 2017-12-27 MED ORDER — MORPHINE SULFATE (PF) 0.5 MG/ML IJ SOLN
INTRAMUSCULAR | Status: AC
Start: 2017-12-27 — End: 2017-12-27
  Filled 2017-12-27: qty 10

## 2017-12-27 MED ORDER — LACTATED RINGERS IV SOLN
INTRAVENOUS | Status: DC
  Administered 2017-12-27 (×2): via INTRAVENOUS

## 2017-12-27 MED ORDER — ONDANSETRON HCL 4 MG/2ML IJ SOLN
INTRAMUSCULAR | Status: DC | PRN
  Administered 2017-12-27: 4 mg via INTRAVENOUS

## 2017-12-27 MED ORDER — BUPIVACAINE IN DEXTROSE 0.75-8.25 % IT SOLN
INTRATHECAL | Status: DC | PRN
  Administered 2017-12-27: 1.6 mL via INTRATHECAL

## 2017-12-27 MED ORDER — LACTATED RINGERS IV SOLN
INTRAVENOUS | Status: DC
  Administered 2017-12-27: 125 mL/h via INTRAVENOUS
  Administered 2017-12-28: 02:00:00 via INTRAVENOUS

## 2017-12-27 MED ORDER — NALOXONE HCL 0.4 MG/ML IJ SOLN: 0.4000 mg | INTRAMUSCULAR | Status: DC | PRN

## 2017-12-27 MED ORDER — FENTANYL CITRATE (PF) 100 MCG/2ML IJ SOLN: 25.0000 ug | INTRAMUSCULAR | Status: DC | PRN

## 2017-12-27 MED ORDER — ZOLPIDEM TARTRATE 5 MG PO TABS: 5.0000 mg | ORAL_TABLET | Freq: Every evening | ORAL | Status: DC | PRN

## 2017-12-27 MED ORDER — SCOPOLAMINE 1 MG/3DAYS TD PT72: 1.0000 | MEDICATED_PATCH | Freq: Once | TRANSDERMAL | Status: DC

## 2017-12-27 MED ORDER — TETANUS-DIPHTH-ACELL PERTUSSIS 5-2.5-18.5 LF-MCG/0.5 IM SUSP: 0.5000 mL | Freq: Once | INTRAMUSCULAR | Status: DC

## 2017-12-27 MED ORDER — SODIUM CHLORIDE 0.9 % IR SOLN
Status: DC | PRN
  Administered 2017-12-27: 1

## 2017-12-27 MED ORDER — ONDANSETRON HCL 4 MG/2ML IJ SOLN: 4.0000 mg | Freq: Three times a day (TID) | INTRAMUSCULAR | Status: DC | PRN

## 2017-12-27 MED ORDER — PHENYLEPHRINE 40 MCG/ML (10ML) SYRINGE FOR IV PUSH (FOR BLOOD PRESSURE SUPPORT)
PREFILLED_SYRINGE | INTRAVENOUS | Status: AC
  Filled 2017-12-27: qty 10

## 2017-12-27 MED ORDER — OXYCODONE-ACETAMINOPHEN 5-325 MG PO TABS
1.0000 | ORAL_TABLET | ORAL | Status: DC | PRN
  Administered 2017-12-28 – 2017-12-30 (×5): 1 via ORAL
  Filled 2017-12-27 (×5): qty 1

## 2017-12-27 MED ORDER — LEVOTHYROXINE SODIUM 125 MCG PO TABS
125.0000 ug | ORAL_TABLET | Freq: Every day | ORAL | Status: DC
  Administered 2017-12-28 – 2017-12-30 (×3): 125 ug via ORAL
  Filled 2017-12-27 (×3): qty 1

## 2017-12-27 MED ORDER — LACTATED RINGERS IV SOLN: INTRAVENOUS | Status: DC

## 2017-12-27 MED ORDER — OXYTOCIN 10 UNIT/ML IJ SOLN
INTRAMUSCULAR | Status: AC
  Filled 2017-12-27: qty 4

## 2017-12-27 MED ORDER — SENNOSIDES-DOCUSATE SODIUM 8.6-50 MG PO TABS
2.0000 | ORAL_TABLET | ORAL | Status: DC
  Administered 2017-12-28 – 2017-12-30 (×3): 2 via ORAL
  Filled 2017-12-27 (×3): qty 2

## 2017-12-27 MED ORDER — SCOPOLAMINE 1 MG/3DAYS TD PT72
1.0000 | MEDICATED_PATCH | TRANSDERMAL | Status: DC
  Administered 2017-12-27: 1.5 mg via TRANSDERMAL
  Filled 2017-12-27: qty 1

## 2017-12-27 MED ORDER — DEXAMETHASONE SODIUM PHOSPHATE 4 MG/ML IJ SOLN
INTRAMUSCULAR | Status: DC | PRN
  Administered 2017-12-27: 4 mg via INTRAVENOUS

## 2017-12-27 MED ORDER — NALOXONE HCL 4 MG/10ML IJ SOLN
1.0000 ug/kg/h | INTRAVENOUS | Status: DC | PRN
  Filled 2017-12-27: qty 5

## 2017-12-27 MED ORDER — NALBUPHINE HCL 10 MG/ML IJ SOLN: 5.0000 mg | INTRAMUSCULAR | Status: DC | PRN

## 2017-12-27 MED ORDER — MENTHOL 3 MG MT LOZG: 1.0000 | LOZENGE | OROMUCOSAL | Status: DC | PRN

## 2017-12-27 MED ORDER — WITCH HAZEL-GLYCERIN EX PADS: 1.0000 "application " | MEDICATED_PAD | CUTANEOUS | Status: DC | PRN

## 2017-12-27 MED ORDER — OXYTOCIN 40 UNITS IN LACTATED RINGERS INFUSION - SIMPLE MED: 2.5000 [IU]/h | INTRAVENOUS | Status: AC

## 2017-12-27 MED ORDER — NALBUPHINE HCL 10 MG/ML IJ SOLN
5.0000 mg | Freq: Once | INTRAMUSCULAR | Status: AC | PRN
  Administered 2017-12-27: 5 mg via SUBCUTANEOUS

## 2017-12-27 MED ORDER — IBUPROFEN 600 MG PO TABS: 600.0000 mg | ORAL_TABLET | Freq: Four times a day (QID) | ORAL | Status: DC

## 2017-12-27 MED ORDER — DIBUCAINE 1 % RE OINT: 1.0000 "application " | TOPICAL_OINTMENT | RECTAL | Status: DC | PRN

## 2017-12-27 MED ORDER — KETOROLAC TROMETHAMINE 30 MG/ML IJ SOLN
INTRAMUSCULAR | Status: AC
  Filled 2017-12-27: qty 1

## 2017-12-27 MED ORDER — FENTANYL CITRATE (PF) 100 MCG/2ML IJ SOLN
INTRAMUSCULAR | Status: DC | PRN
  Administered 2017-12-27: 10 ug via INTRATHECAL

## 2017-12-27 MED ORDER — METHYLERGONOVINE MALEATE 0.2 MG/ML IJ SOLN: 0.2000 mg | INTRAMUSCULAR | Status: DC | PRN

## 2017-12-27 MED ORDER — NALBUPHINE HCL 10 MG/ML IJ SOLN: 5.0000 mg | Freq: Once | INTRAMUSCULAR | Status: AC | PRN

## 2017-12-27 MED ORDER — KETOROLAC TROMETHAMINE 30 MG/ML IJ SOLN
30.0000 mg | Freq: Four times a day (QID) | INTRAMUSCULAR | Status: AC | PRN
  Administered 2017-12-27: 30 mg via INTRAMUSCULAR

## 2017-12-27 MED ORDER — KETOROLAC TROMETHAMINE 30 MG/ML IJ SOLN: 30.0000 mg | Freq: Four times a day (QID) | INTRAMUSCULAR | Status: AC | PRN

## 2017-12-27 MED ORDER — MORPHINE SULFATE (PF) 0.5 MG/ML IJ SOLN
INTRAMUSCULAR | Status: DC | PRN
  Administered 2017-12-27: .2 mg via INTRATHECAL

## 2017-12-27 MED ORDER — DIPHENHYDRAMINE HCL 25 MG PO CAPS
25.0000 mg | ORAL_CAPSULE | ORAL | Status: DC | PRN
  Filled 2017-12-27: qty 1

## 2017-12-27 MED ORDER — FENTANYL CITRATE (PF) 100 MCG/2ML IJ SOLN
INTRAMUSCULAR | Status: AC
  Filled 2017-12-27: qty 2

## 2017-12-27 MED ORDER — OXYTOCIN 10 UNIT/ML IJ SOLN
INTRAVENOUS | Status: DC | PRN
  Administered 2017-12-27: 40 [IU] via INTRAVENOUS

## 2017-12-27 MED ORDER — PHENYLEPHRINE 8 MG IN D5W 100 ML (0.08MG/ML) PREMIX OPTIME
INJECTION | INTRAVENOUS | Status: DC | PRN
  Administered 2017-12-27: 60 ug/min via INTRAVENOUS

## 2017-12-27 MED ORDER — DEXAMETHASONE SODIUM PHOSPHATE 4 MG/ML IJ SOLN
INTRAMUSCULAR | Status: AC
  Filled 2017-12-27: qty 1

## 2017-12-27 MED ORDER — ONDANSETRON HCL 4 MG/2ML IJ SOLN
INTRAMUSCULAR | Status: AC
  Filled 2017-12-27: qty 2

## 2017-12-27 MED ORDER — OXYCODONE-ACETAMINOPHEN 5-325 MG PO TABS: 2.0000 | ORAL_TABLET | ORAL | Status: DC | PRN

## 2017-12-27 MED ORDER — COCONUT OIL OIL: 1.0000 "application " | TOPICAL_OIL | Status: DC | PRN

## 2017-12-27 MED ORDER — METHYLERGONOVINE MALEATE 0.2 MG PO TABS: 0.2000 mg | ORAL_TABLET | ORAL | Status: DC | PRN

## 2017-12-27 MED ORDER — DIPHENHYDRAMINE HCL 50 MG/ML IJ SOLN: 12.5000 mg | INTRAMUSCULAR | Status: DC | PRN

## 2017-12-27 MED ORDER — SODIUM CHLORIDE 0.9% FLUSH: 3.0000 mL | INTRAVENOUS | Status: DC | PRN

## 2017-12-27 MED ORDER — NALBUPHINE HCL 10 MG/ML IJ SOLN
INTRAMUSCULAR | Status: AC
  Filled 2017-12-27: qty 1

## 2017-12-27 MED ORDER — CEFAZOLIN SODIUM-DEXTROSE 2-4 GM/100ML-% IV SOLN
2.0000 g | INTRAVENOUS | Status: AC
  Administered 2017-12-27: 2 g via INTRAVENOUS
  Filled 2017-12-27: qty 100

## 2017-12-27 SURGICAL SUPPLY — 30 items
CHLORAPREP W/TINT 26ML (MISCELLANEOUS) ×2 IMPLANT
CLAMP CORD UMBIL (MISCELLANEOUS) IMPLANT
CLOTH BEACON ORANGE TIMEOUT ST (SAFETY) ×2 IMPLANT
DERMABOND ADVANCED (GAUZE/BANDAGES/DRESSINGS) ×1
DERMABOND ADVANCED .7 DNX12 (GAUZE/BANDAGES/DRESSINGS) ×1 IMPLANT
DRSG OPSITE POSTOP 4X10 (GAUZE/BANDAGES/DRESSINGS) ×2 IMPLANT
ELECT REM PT RETURN 9FT ADLT (ELECTROSURGICAL) ×2
ELECTRODE REM PT RTRN 9FT ADLT (ELECTROSURGICAL) ×1 IMPLANT
EXTRACTOR VACUUM M CUP 4 TUBE (SUCTIONS) ×2 IMPLANT
GLOVE BIO SURGEON STRL SZ7 (GLOVE) ×2 IMPLANT
GLOVE BIOGEL PI IND STRL 7.0 (GLOVE) ×1 IMPLANT
GLOVE BIOGEL PI INDICATOR 7.0 (GLOVE) ×1
GOWN STRL REUS W/TWL LRG LVL3 (GOWN DISPOSABLE) ×4 IMPLANT
KIT ABG SYR 3ML LUER SLIP (SYRINGE) IMPLANT
NEEDLE HYPO 22GX1.5 SAFETY (NEEDLE) IMPLANT
NEEDLE HYPO 25X5/8 SAFETYGLIDE (NEEDLE) IMPLANT
NS IRRIG 1000ML POUR BTL (IV SOLUTION) ×2 IMPLANT
PACK C SECTION WH (CUSTOM PROCEDURE TRAY) ×2 IMPLANT
PAD OB MATERNITY 4.3X12.25 (PERSONAL CARE ITEMS) ×2 IMPLANT
PENCIL SMOKE EVAC W/HOLSTER (ELECTROSURGICAL) ×2 IMPLANT
RTRCTR C-SECT PINK 25CM LRG (MISCELLANEOUS) ×2 IMPLANT
SUT CHROMIC 1 CTX 36 (SUTURE) ×4 IMPLANT
SUT CHROMIC 2 0 CT 1 (SUTURE) ×2 IMPLANT
SUT PDS AB 0 CTX 60 (SUTURE) ×2 IMPLANT
SUT VIC AB 2-0 CT1 27 (SUTURE) ×1
SUT VIC AB 2-0 CT1 TAPERPNT 27 (SUTURE) ×1 IMPLANT
SUT VIC AB 4-0 KS 27 (SUTURE) IMPLANT
SYR 30ML LL (SYRINGE) IMPLANT
TOWEL OR 17X24 6PK STRL BLUE (TOWEL DISPOSABLE) ×2 IMPLANT
TRAY FOLEY BAG SILVER LF 14FR (SET/KITS/TRAYS/PACK) ×2 IMPLANT

## 2017-12-27 NOTE — Op Note (Signed)
Pre-Operative Diagnosis: 1) 35+6-week intrauterine pregnancy 2) vasa previa  Postoperative Diagnosis: 1) 35+6-week intrauterine pregnancy 2) vasa previa  Procedure: Primary low transverse cesarean section Surgeon: Dr. Waynard Reeds Assistant: Webb Silversmith, RNFA Operative Findings: Vigorous female infant in the vertex presentation.  Fetal Apgars were 6 at 1 minute, 8 at 5 minutes, 10 at 10 minutes.  Upon entry into the uterus the amniotic sac bulged outward through the hysterotomy fetal vessels could be seen coursing within the membranes.  Amniotomy was performed and a clear section of the amniotic sac remote from any fetal vessels.  Normal-appearing ovaries, tubes, uterus.  Specimen: Placenta to labor and delivery for disposal EBL: Total I/O In: 1700 [I.V.:1700] Out: 983 [Urine:50; Blood:933]   Procedure:Carla Le is an 32 year old gravida 3 para 1011 at 44 weeks and 6 days estimated gestational age who presents for cesarean section.  The patient has been followed jointly with maternal fetal medicine for vasa previa that was identified on her initial anatomic survey.  During the course of her pregnancy the vasa previa moved 1.5 cm away from the internal cervical office.  Therefore the recommendation was made to proceed with cesarean delivery between 35 and 36 weeks.  Betamethasone was administered prior to delivery. Following the appropriate informed consent the patient was brought to the operating room where spinal anesthesia was administered and found to be adequate. She was placed in the dorsal supine position with a leftward tilt. She was prepped and draped in the normal sterile fashion.  Prior to initiating the surgical procedure the patient was appropriately identified in a preoperative timeout procedure . The scalpel was then used to make a Pfannenstiel skin incision which was carried down to the underlying layers of soft tissue to the fascia. The fascia was incised in the midline and the  fascial incision was extended laterally with Mayo scissors. The superior aspect of the fascial incision was grasped with Coker clamps x2, tented up and the rectus muscles dissected off sharply with the electrocautery unit area and the same procedure was repeated on the inferior aspect of the fascial incision. The rectus muscles were separated in the midline. The abdominal peritoneum was identified, tented up, entered sharply, and the incision was extended superiorly and inferiorly with good visualization of the bladder. The Alexis retractor was then deployed. The vesicouterine peritoneum was identified, tented up, entered sharply, and the bladder flap was created digitally. . Scalpel was then used to make a low transverse incision on the uterus which was extended laterally with blunt dissection. The operative findings upon hysterotomy are noted above. The fetal vertex was identified, delivered easily through the uterine incision followed by the body. The infant was bulb suctioned on the operative field cried vigorously, cord was clamped and cut and the infant was passed to the waiting neonatologist.  A delayed cord clamping was not performed in this case due to concern for unknown rupture of fetal vessel when delivering the baby.  The Placenta was then delivered spontaneously, the uterus was cleared of all clot and debris. The uterine incision was repaired with #1 chromic in running locked fashion followed by a second imbricating layer. Ovaries and tubes were inspected and normal. The Alexis retractor was removed. The abdominal peritoneum was reapproximated with 2-0 Vicryl in a running fashion, the rectus muscles was reapproximated with 2-0 chromic in a running fashion. The fascia was closed with a looped PDS in a running fashion. The skin was closed with 4-0 vicryl in a subcuticular fashion and Dermabond.  All sponge lap and needle counts were correct x2. Patient tolerated the procedure well and recovered in stable  condition following the procedure.

## 2017-12-27 NOTE — Anesthesia Procedure Notes (Signed)
Spinal  Patient location during procedure: OR Start time: 12/27/2017 11:50 AM End time: 12/27/2017 11:53 AM Staffing Anesthesiologist: Effie Berkshire, MD Preanesthetic Checklist Completed: patient identified, site marked, surgical consent, pre-op evaluation, timeout performed, IV checked, risks and benefits discussed and monitors and equipment checked Spinal Block Patient position: sitting Prep: DuraPrep Patient monitoring: heart rate, continuous pulse ox, blood pressure and cardiac monitor Approach: midline Location: L4-5 Injection technique: single-shot Needle Needle type: Introducer and Pencan  Needle gauge: 24 G Needle length: 9 cm Additional Notes Negative paresthesia. Negative blood return. Positive free-flowing CSF. Expiration date of kit checked and confirmed. Patient tolerated procedure well, without complications.

## 2017-12-27 NOTE — Anesthesia Postprocedure Evaluation (Signed)
Anesthesia Post Note  Patient: Jiles CrockerLauren Kiker  Procedure(s) Performed: CESAREAN SECTION (N/A )     Patient location during evaluation: PACU Anesthesia Type: Spinal Level of consciousness: oriented and awake and alert Pain management: pain level controlled Vital Signs Assessment: post-procedure vital signs reviewed and stable Respiratory status: spontaneous breathing, respiratory function stable and patient connected to nasal cannula oxygen Cardiovascular status: blood pressure returned to baseline and stable Postop Assessment: no headache, no backache, no apparent nausea or vomiting, spinal receding and patient able to bend at knees Anesthetic complications: no    Last Vitals:  Vitals:   12/27/17 1300 12/27/17 1301  BP: (!) 101/51   Pulse: (!) 58 67  Resp: 11 20  Temp:    SpO2: 98% 98%    Last Pain:  Vitals:   12/27/17 1025  TempSrc: Oral   Pain Goal:                 Shelton SilvasKevin D Tunya Held

## 2017-12-27 NOTE — H&P (Signed)
Carla Le is a 32 y.o. female presenting for primary cesarean section for vasa previa  32 yo G3P1011 @ 35+6 presents for primary cesarean section for vasa previa. THe patient has been followed jointly with MFM for suspected vasa previa on her anatomic survey. Vasa previa was confirmed at MFM. As the pregnancy progress the fetal vessel appeared to move 1.5 cm away from the internal cervical os. MFM's consensus was to deliver by C/S Btween 35-36 weeks and administer BMZ prior to delivery. OB History    Gravida  3   Para  1   Term  1   Preterm      AB  1   Living  1     SAB  1   TAB      Ectopic      Multiple      Live Births  1          Past Medical History:  Diagnosis Date  . Abnormal Pap smear   . Hypothyroidism   . Vaginal Pap smear, abnormal   . Vasa previa    Past Surgical History:  Procedure Laterality Date  . WISDOM TOOTH EXTRACTION     Family History: family history includes Heart attack in her father; Heart disease in her father; Hypertension in her father. Social History:  reports that she has never smoked. She has never used smokeless tobacco. She reports that she does not drink alcohol or use drugs.     Maternal Diabetes: No Genetic Screening: Normal Maternal Ultrasounds/Referrals: Normal Fetal Ultrasounds or other Referrals:  Referred to Materal Fetal Medicine  Maternal Substance Abuse:  No Significant Maternal Medications:  None Significant Maternal Lab Results:  None Other Comments:  A neg  ROS History   Weight 89.6 kg (197 lb 8 oz), last menstrual period 04/01/2017, unknown if currently breastfeeding. Exam Physical Exam  Prenatal labs: ABO, Rh: --/--/A NEG (03/20 1015) Antibody: POS (03/20 1015) Rubella: Immune (09/27 0000) RPR: Non Reactive (03/20 1015)  HBsAg: Negative (09/27 0000)  HIV: Non-reactive (09/27 0000)  GBS:     Assessment/Plan: 1) Consent optained 2) Proceed with primary C/S for vasa previa 3) Will have NICU  team in the room prior to initiating case 4) SCDs for DVT prophylaxis 5) Ancef 2 grams prior to skin incision   Waynard Reeds 12/27/2017, 10:08 AM

## 2017-12-27 NOTE — Addendum Note (Signed)
Addendum  created 12/27/17 1711 by Graciela HusbandsFussell, Jonatha Gagen O, CRNA   Sign clinical note

## 2017-12-27 NOTE — Lactation Note (Signed)
This note was copied from a baby's chart. Lactation Consultation Note  Patient Name: Carla Le ZOXWR'UToday's Date: 12/27/2017 Reason for consult: Initial assessment;Late-preterm 34-36.6wks  6 hours old late preterm female who is being exclusively BF by his mother, she's a P2. Mom is experienced BF, she BF her older child for 13 months.   Mom is already pumping, and she was able to get 5 ml of colostrum on each pumping session. She still hasn't fed the last 5 ml; she's been waiting for the 3 hours mark to do so. Baby's pediatrician was also in the room and she mentioned that baby may need to be supplemented with formula despite mother's excellent milk supply due to his prematurity.  Encouraged mom to keep putting baby at the breast STS 8-12 times/24 hours to attempt to latch/feed, in the meantime, she'll also keep pumping every 3 hours and feed that EBM back to baby with the curved tip syringe.  Reviewed how to take care of your late pre-term baby, BF brochure, BF resources and feeding diary. Mom is aware of LC services and will call PRN.  Maternal Data Formula Feeding for Exclusion: No Has patient been taught Hand Expression?: Yes Does the patient have breastfeeding experience prior to this delivery?: Yes  Feeding Feeding Type: Breast Fed Length of feed: 5 min  Interventions Interventions: Breast feeding basics reviewed  Lactation Tools Discussed/Used WIC Program: No   Consult Status Consult Status: Follow-up Date: 12/28/17 Follow-up type: In-patient    Carla Le Carla Le 12/27/2017, 7:05 PM

## 2017-12-27 NOTE — Anesthesia Preprocedure Evaluation (Addendum)
Anesthesia Evaluation  Patient identified by MRN, date of birth, ID band Patient awake    Reviewed: Allergy & Precautions, NPO status , Patient's Chart, lab work & pertinent test results  Airway Mallampati: I  TM Distance: >3 FB Neck ROM: Full    Dental  (+) Teeth Intact, Dental Advisory Given   Pulmonary neg pulmonary ROS,    breath sounds clear to auscultation       Cardiovascular negative cardio ROS   Rhythm:Regular Rate:Normal     Neuro/Psych negative neurological ROS  negative psych ROS   GI/Hepatic negative GI ROS, Neg liver ROS,   Endo/Other  Hypothyroidism   Renal/GU      Musculoskeletal negative musculoskeletal ROS (+)   Abdominal   Peds  Hematology negative hematology ROS (+)   Anesthesia Other Findings   Reproductive/Obstetrics (+) Pregnancy                            Lab Results  Component Value Date   WBC 19.7 (H) 12/26/2017   HGB 11.6 (L) 12/26/2017   HCT 34.1 (L) 12/26/2017   MCV 89.7 12/26/2017   PLT 190 12/26/2017   Anesthesia Physical Anesthesia Plan  ASA: II  Anesthesia Plan: Spinal   Post-op Pain Management:    Induction:   PONV Risk Score and Plan: 3 and Ondansetron and Treatment may vary due to age or medical condition  Airway Management Planned: Nasal Cannula  Additional Equipment: None  Intra-op Plan:   Post-operative Plan:   Informed Consent: I have reviewed the patients History and Physical, chart, labs and discussed the procedure including the risks, benefits and alternatives for the proposed anesthesia with the patient or authorized representative who has indicated his/her understanding and acceptance.     Plan Discussed with: CRNA  Anesthesia Plan Comments:         Anesthesia Quick Evaluation

## 2017-12-27 NOTE — Anesthesia Postprocedure Evaluation (Signed)
Anesthesia Post Note  Patient: Carla CrockerLauren Le  Procedure(s) Performed: CESAREAN SECTION (N/A )     Patient location during evaluation: Mother Baby Anesthesia Type: Spinal Level of consciousness: awake and alert and oriented Pain management: satisfactory to patient Vital Signs Assessment: post-procedure vital signs reviewed and stable Respiratory status: respiratory function stable and spontaneous breathing Cardiovascular status: blood pressure returned to baseline Postop Assessment: no headache, no backache, spinal receding, patient able to bend at knees and adequate PO intake Anesthetic complications: no    Last Vitals:  Vitals:   12/27/17 1535 12/27/17 1637  BP: 91/62 112/65  Pulse: 60 (!) 53  Resp:  18  Temp: 36.6 C 36.6 C  SpO2: 96% 97%    Last Pain:  Vitals:   12/27/17 1642  TempSrc:   PainSc: 1    Pain Goal:                 [name]

## 2017-12-27 NOTE — Transfer of Care (Signed)
Immediate Anesthesia Transfer of Care Note  Patient: Carla Le  Procedure(s) Performed: CESAREAN SECTION (N/A )  Patient Location: PACU  Anesthesia Type:Spinal  Level of Consciousness: awake, alert  and oriented  Airway & Oxygen Therapy: Patient Spontanous Breathing and Patient connected to nasal cannula oxygen  Post-op Assessment: Report given to RN and Post -op Vital signs reviewed and stable  Post vital signs: Reviewed and stable  Last Vitals:  Vitals Value Taken Time  BP    Temp    Pulse 75 12/27/2017 12:55 PM  Resp 12 12/27/2017 12:55 PM  SpO2 96 % 12/27/2017 12:55 PM  Vitals shown include unvalidated device data.  Last Pain:  Vitals:   12/27/17 1025  TempSrc: Oral         Complications: No apparent anesthesia complications

## 2017-12-28 LAB — CBC
HCT: 26 % — ABNORMAL LOW (ref 36.0–46.0)
HEMOGLOBIN: 9 g/dL — AB (ref 12.0–15.0)
MCH: 30.9 pg (ref 26.0–34.0)
MCHC: 34.2 g/dL (ref 30.0–36.0)
MCV: 90.3 fL (ref 78.0–100.0)
Platelets: 173 10*3/uL (ref 150–400)
RBC: 2.88 MIL/uL — ABNORMAL LOW (ref 3.87–5.11)
RDW: 13.5 % (ref 11.5–15.5)
WBC: 15.4 10*3/uL — ABNORMAL HIGH (ref 4.0–10.5)

## 2017-12-28 LAB — BIRTH TISSUE RECOVERY COLLECTION (PLACENTA DONATION)

## 2017-12-28 MED ORDER — RHO D IMMUNE GLOBULIN 1500 UNIT/2ML IJ SOSY
300.0000 ug | PREFILLED_SYRINGE | Freq: Once | INTRAMUSCULAR | Status: AC
  Administered 2017-12-28: 300 ug via INTRAVENOUS
  Filled 2017-12-28: qty 2

## 2017-12-28 NOTE — Progress Notes (Signed)
POD#1 Pt without complaints. Lochia- wnl VSSAF IMP/ Stable Plan/Routine care 

## 2017-12-29 ENCOUNTER — Other Ambulatory Visit: Payer: Self-pay

## 2017-12-29 LAB — RH IG WORKUP (INCLUDES ABO/RH)
ABO/RH(D): A NEG
Fetal Screen: NEGATIVE
Gestational Age(Wks): 35.6
UNIT DIVISION: 0

## 2017-12-29 NOTE — Lactation Note (Signed)
This note was copied from a baby's chart. Lactation Consultation Note  Patient Name: Carla Jiles CrockerLauren Myre WFUXN'AToday's Date: 12/29/2017 Reason for consult: Follow-up assessment;Late-preterm 34-36.6wks;Infant weight loss;Hyperbilirubinemia  Visited with P2 Mom of LPTI at 5858 hrs old. Baby at 7% weight loss.  Baby swaddled in the crib sleeping after what Mom states was a 40 min feeding on both breasts.  Mom states that baby latches deeply and denies discomfort.   EBM noted on counter.  Asked if baby received supplement, and Mom stated no, as baby fed so well.  Recommended they give baby the EBM after every breastfeed, as baby is a late preterm infant.    Assisted with curved tip syringe feeding.  Gave a 5 fr feeding tube and syringe to tape to finger for the next supplement.      Mom states she has EBM in refrigerator.  Encouraged them to give baby the milk after each feeding 15-20 ml after a good breastfeeding.  Encouraged STS and feeding baby on cue, or awakening him at 3 hrs. To call for assistance prn.  Interventions Interventions: Hand express;Expressed milk;DEBP  Lactation Tools Discussed/Used Tools: Pump Breast pump type: Double-Electric Breast Pump   Consult Status Consult Status: Follow-up Date: 12/30/17 Follow-up type: In-patient    Judee ClaraSmith, Inmer Nix E 12/29/2017, 10:49 PM

## 2017-12-29 NOTE — Progress Notes (Signed)
POD#2 Pt doing well. Baby doing well VSSAF Lochia wnl IMP/ Stable Plan/ Discharge tomorrow.

## 2017-12-30 LAB — TYPE AND SCREEN
ABO/RH(D): A NEG
Antibody Screen: POSITIVE
UNIT DIVISION: 0
UNIT DIVISION: 0

## 2017-12-30 LAB — BPAM RBC
BLOOD PRODUCT EXPIRATION DATE: 201904082359
Blood Product Expiration Date: 201904152359
Unit Type and Rh: 600
Unit Type and Rh: 600

## 2017-12-30 MED ORDER — OXYCODONE-ACETAMINOPHEN 5-325 MG PO TABS: 2.0000 | ORAL_TABLET | ORAL | 0 refills | Status: DC | PRN

## 2017-12-30 NOTE — Discharge Summary (Signed)
Obstetric Discharge Summary Reason for Admission: cesarean section Prenatal Procedures: ultrasound Intrapartum Procedures: cesarean: low cervical, transverse Postpartum Procedures: none Complications-Operative and Postpartum: none Hemoglobin  Date Value Ref Range Status  12/28/2017 9.0 (L) 12.0 - 15.0 g/dL Final    Comment:    REPEATED TO VERIFY DELTA CHECK NOTED    HCT  Date Value Ref Range Status  12/28/2017 26.0 (L) 36.0 - 46.0 % Final    Physical Exam:  General: alert Lochia: appropriate Uterine Fundus: firm Incision: healing well DVT Evaluation: No evidence of DVT seen on physical exam.  Discharge Diagnoses: 36 wk IUP with vasa previa  Discharge Information: Date: 12/30/2017 Activity: pelvic rest Diet: routine Medications: Ibuprofen and Percocet Condition: improved Instructions: refer to practice specific booklet Discharge to: home Follow-up Information    Waynard Reedsoss, Kendra, MD. Schedule an appointment as soon as possible for a visit in 1 month(s).   Specialty:  Obstetrics and Gynecology Contact information: [address] [place] [address] [phone]           Newborn Data: Live born female  Birth Weight: 6 lb 10.9 oz (3030 g) APGAR: 6, 8  Newborn Delivery   Birth date/time:  12/27/2017 12:16:00 Delivery type:  C-Section, Low Transverse C-section categorization:  Primary     Home with mother.  ANDERSON,MARK E 12/30/2017, 9:33 AM

## 2017-12-30 NOTE — Lactation Note (Signed)
This note was copied from a baby's chart. Lactation Consultation Note  Patient Name: Carla Jiles CrockerLauren Le YNWGN'FToday's Date: 12/30/2017 Reason for consult: Follow-up assessment   Baby 70 hours old.  5034w6d. Mother pumping good volume of breastmilk and supplementing with each feeding. Reviewed engorgement care and monitoring voids/stools. Mom encouraged to feed baby 8-12 times/24 hours and with feeding cues.  Denies questions or concerns.    Maternal Data    Feeding Feeding Type: Breast Milk Length of feed: 15 min  LATCH Score Latch: Repeated attempts needed to sustain latch, nipple held in mouth throughout feeding, stimulation needed to elicit sucking reflex.  Audible Swallowing: Spontaneous and intermittent  Type of Nipple: Everted at rest and after stimulation  Comfort (Breast/Nipple): Soft / non-tender  Hold (Positioning): No assistance needed to correctly position infant at breast.  LATCH Score: 9  Interventions Interventions: DEBP;Expressed milk  Lactation Tools Discussed/Used Breast pump type: Double-Electric Breast Pump   Consult Status Consult Status: Complete    Hardie PulleyBerkelhammer, Betsey Sossamon Boschen 12/30/2017, 11:02 AM

## 2017-12-30 NOTE — Progress Notes (Signed)
POD#3 Pt without complaints. Lochia wnl VSSAF IMP/ Doing well Plan/ Will discharge.

## 2018-01-24 DIAGNOSIS — Z6827 Body mass index (BMI) 27.0-27.9, adult: Secondary | ICD-10-CM | POA: Diagnosis not present

## 2018-01-24 DIAGNOSIS — Z124 Encounter for screening for malignant neoplasm of cervix: Secondary | ICD-10-CM | POA: Diagnosis not present

## 2018-02-14 DIAGNOSIS — Z6827 Body mass index (BMI) 27.0-27.9, adult: Secondary | ICD-10-CM | POA: Diagnosis not present

## 2018-02-14 DIAGNOSIS — Z3202 Encounter for pregnancy test, result negative: Secondary | ICD-10-CM | POA: Diagnosis not present

## 2018-02-14 DIAGNOSIS — Z30430 Encounter for insertion of intrauterine contraceptive device: Secondary | ICD-10-CM | POA: Diagnosis not present

## 2018-02-21 DIAGNOSIS — H5201 Hypermetropia, right eye: Secondary | ICD-10-CM | POA: Diagnosis not present

## 2018-04-02 DIAGNOSIS — Z6827 Body mass index (BMI) 27.0-27.9, adult: Secondary | ICD-10-CM | POA: Diagnosis not present

## 2018-04-02 DIAGNOSIS — Z30431 Encounter for routine checking of intrauterine contraceptive device: Secondary | ICD-10-CM | POA: Diagnosis not present

## 2018-05-31 DIAGNOSIS — E039 Hypothyroidism, unspecified: Secondary | ICD-10-CM | POA: Diagnosis not present

## 2018-07-22 ENCOUNTER — Other Ambulatory Visit: Payer: Self-pay

## 2018-07-22 ENCOUNTER — Encounter (HOSPITAL_COMMUNITY): Payer: Self-pay | Admitting: Student

## 2018-07-22 ENCOUNTER — Inpatient Hospital Stay (HOSPITAL_COMMUNITY)
Admission: AD | Admit: 2018-07-22 | Discharge: 2018-07-23 | Disposition: A | Discharge status: Home / Self Care | Payer: BLUE CROSS/BLUE SHIELD | Source: Ambulatory Visit | Attending: Obstetrics | Admitting: Obstetrics

## 2018-07-22 ENCOUNTER — Inpatient Hospital Stay (HOSPITAL_COMMUNITY): Payer: BLUE CROSS/BLUE SHIELD

## 2018-07-22 DIAGNOSIS — R109 Unspecified abdominal pain: Secondary | ICD-10-CM | POA: Insufficient documentation

## 2018-07-22 DIAGNOSIS — R102 Pelvic and perineal pain unspecified side: Secondary | ICD-10-CM

## 2018-07-22 DIAGNOSIS — R7989 Other specified abnormal findings of blood chemistry: Secondary | ICD-10-CM | POA: Diagnosis not present

## 2018-07-22 DIAGNOSIS — N83201 Unspecified ovarian cyst, right side: Secondary | ICD-10-CM

## 2018-07-22 DIAGNOSIS — R945 Abnormal results of liver function studies: Secondary | ICD-10-CM

## 2018-07-22 DIAGNOSIS — N939 Abnormal uterine and vaginal bleeding, unspecified: Secondary | ICD-10-CM | POA: Diagnosis not present

## 2018-07-22 DIAGNOSIS — Z975 Presence of (intrauterine) contraceptive device: Secondary | ICD-10-CM

## 2018-07-22 DIAGNOSIS — R1031 Right lower quadrant pain: Secondary | ICD-10-CM | POA: Diagnosis not present

## 2018-07-22 LAB — CBC WITH DIFFERENTIAL/PLATELET
BASOS PCT: 0 %
Basophils Absolute: 0 10*3/uL (ref 0.0–0.1)
EOS PCT: 0 %
Eosinophils Absolute: 0 10*3/uL (ref 0.0–0.5)
HEMATOCRIT: 37.4 % (ref 36.0–46.0)
Hemoglobin: 12.6 g/dL (ref 12.0–15.0)
Lymphocytes Relative: 12 %
Lymphs Abs: 1.8 10*3/uL (ref 0.7–4.0)
MCH: 29.6 pg (ref 26.0–34.0)
MCHC: 33.7 g/dL (ref 30.0–36.0)
MCV: 87.8 fL (ref 80.0–100.0)
MONO ABS: 0.6 10*3/uL (ref 0.1–1.0)
Monocytes Relative: 4 %
NEUTROS ABS: 12.1 10*3/uL — AB (ref 1.7–7.7)
Neutrophils Relative %: 84 %
PLATELETS: 227 10*3/uL (ref 150–400)
RBC: 4.26 MIL/uL (ref 3.87–5.11)
RDW: 12.9 % (ref 11.5–15.5)
WBC: 14.6 10*3/uL — ABNORMAL HIGH (ref 4.0–10.5)
nRBC: 0 % (ref 0.0–0.2)

## 2018-07-22 LAB — URINALYSIS, ROUTINE W REFLEX MICROSCOPIC
BILIRUBIN URINE: NEGATIVE
Glucose, UA: NEGATIVE mg/dL
HGB URINE DIPSTICK: NEGATIVE
KETONES UR: NEGATIVE mg/dL
LEUKOCYTES UA: NEGATIVE
Nitrite: NEGATIVE
PROTEIN: NEGATIVE mg/dL
Specific Gravity, Urine: 1.025 (ref 1.005–1.030)
pH: 6 (ref 5.0–8.0)

## 2018-07-22 LAB — POCT PREGNANCY, URINE: Preg Test, Ur: NEGATIVE

## 2018-07-22 MED ORDER — PROMETHAZINE HCL 25 MG/ML IJ SOLN
12.5000 mg | Freq: Once | INTRAMUSCULAR | Status: AC
  Administered 2018-07-22: 12.5 mg via INTRAMUSCULAR
  Filled 2018-07-22: qty 1

## 2018-07-22 MED ORDER — HYDROMORPHONE HCL 1 MG/ML IJ SOLN
1.0000 mg | Freq: Once | INTRAMUSCULAR | Status: AC
  Administered 2018-07-22: 1 mg via INTRAMUSCULAR
  Filled 2018-07-22: qty 1

## 2018-07-22 NOTE — MAU Provider Note (Signed)
Chief Complaint: Abdominal Pain   First Provider Initiated Contact with Patient 07/22/18 2229     SUBJECTIVE HPI: Carla Le is a 32 y.o. non pregnant female who presents to Maternity Admissions reporting abdominal pain. She had a c/section in march & mirena IUD placed in June. Symptoms began this afternoon but worsened around 7 pm. Had temp of 101.1 at home. Took tylenol 1 gm & naproxen 500 mg prior to coming to MAU. Meds helped with fever but did not help with pain. States she vomited once d/t the pain but does not continue to be nauseated. Had some spotting earlier today but bleeding has not continued. Denies diarrhea, constipation, dysuria, or vaginal discharge.   Location: lower abdomen Quality: cramping/throbbing Severity: 8/10 on pain scale Duration: 3 hours Timing: intermittent Modifying factors: pain worse with moving. Pain down to 5/10 when lying still. Not improved with tyelnol & naproxen Associated signs and symptoms: fever  Past Medical History:  Diagnosis Date  . Abnormal Pap smear   . Hypothyroidism   . Vasa previa    OB History  Gravida Para Term Preterm AB Living  3 2 1 1 1 2   SAB TAB Ectopic Multiple Live Births  1     0 2    # Outcome Date GA Lbr Len/2nd Weight Sex Delivery Anes PTL Lv  3 Preterm 12/27/17 [redacted]w[redacted]d  3030 g M CS-LTranv Spinal  LIV  2 Term 06/03/13 [redacted]w[redacted]d 20:09 / 00:58 4055 g M Vag-Spont EPI  LIV  1 SAB 2013           Past Surgical History:  Procedure Laterality Date  . CESAREAN SECTION N/A 12/27/2017   Procedure: CESAREAN SECTION;  Surgeon: Waynard Reeds, MD;  Location: [facility]THING [facility];  Service: Obstetrics;  Laterality: N/A;  . WISDOM TOOTH EXTRACTION     Social History   Socioeconomic History  . Marital status: Married    Spouse name: Not on file  . Number of children: Not on file  . Years of education: Not on file  . Highest education level: Not on file  Occupational History  . Not on file  Social Needs  . Financial resource  strain: Not on file  . Food insecurity:    Worry: Not on file    Inability: Not on file  . Transportation needs:    Medical: Not on file    Non-medical: Not on file  Tobacco Use  . Smoking status: Never Smoker  . Smokeless tobacco: Never Used  Substance and Sexual Activity  . Alcohol use: No  . Drug use: No  . Sexual activity: Not Currently    Birth control/protection: None  Lifestyle  . Physical activity:    Days per week: Not on file    Minutes per session: Not on file  . Stress: Not on file  Relationships  . Social connections:    Talks on phone: Not on file    Gets together: Not on file    Attends religious service: Not on file    Active member of club or organization: Not on file    Attends meetings of clubs or organizations: Not on file    Relationship status: Not on file  . Intimate partner violence:    Fear of current or ex partner: Not on file    Emotionally abused: Not on file    Physically abused: Not on file    Forced sexual activity: Not on file  Other Topics Concern  . Not on file  Social History Narrative  . Not on file   Family History  Problem Relation Age of Onset  . Hypertension Father   . Heart disease Father   . Heart attack Father    No current facility-administered medications on file prior to encounter.    Current Outpatient Medications on File Prior to Encounter  Medication Sig Dispense Refill  . levonorgestrel (MIRENA, 52 MG,) 20 MCG/24HR IUD Mirena 20 mcg/24 hours (5 yrs) 52 mg intrauterine device  Take 1 device by intrauterine route.     Allergies  Allergen Reactions  . Sulfa Antibiotics Hives and Rash    I have reviewed patient's Past Medical Hx, Surgical Hx, Family Hx, Social Hx, medications and allergies.   Review of Systems  Constitutional: Positive for chills and fever.  Gastrointestinal: Positive for abdominal pain and vomiting. Negative for constipation, diarrhea and nausea.  Genitourinary: Positive for vaginal discharge  (episode of spotting earlier today). Negative for dyspareunia and dysuria.    OBJECTIVE Patient Vitals for the past 24 hrs:  BP Temp Temp src Pulse Resp SpO2 Weight  07/23/18 0347 (!) 99/59 97.8 F (36.6 C) Oral 89 17 100 % -  07/23/18 0015 - 97.7 F (36.5 C) Oral - - - -  07/22/18 2134 103/65 98.3 F (36.8 C) Oral (!) 103 17 - 78.1 kg   Constitutional: Well-developed, well-nourished female in no acute distress.  Cardiovascular: normal rate & rhythm, no murmur Respiratory: normal rate and effort. Lung sounds clear throughout GI: TTP throughout lower abdomen R>L. Rebound tenderness in RLQ. Abdomen soft. BS x 4. No guarding.  MS: Extremities nontender, no edema, normal ROM Neurologic: Alert and oriented x 4.  GU:     SPECULUM EXAM: NEFG, physiologic discharge, no blood noted, cervix clean. IUD strings visualized  BIMANUAL: No CMT.  uterus normal size, no adnexal tenderness or masses.    LAB RESULTS Results for orders placed or performed during the hospital encounter of 07/22/18 (from the past 24 hour(s))  Urinalysis, Routine w reflex microscopic     Status: None   Collection Time: 07/22/18  9:45 PM  Result Value Ref Range   Color, Urine YELLOW YELLOW   APPearance CLEAR CLEAR   Specific Gravity, Urine 1.025 1.005 - 1.030   pH 6.0 5.0 - 8.0   Glucose, UA NEGATIVE NEGATIVE mg/dL   Hgb urine dipstick NEGATIVE NEGATIVE   Bilirubin Urine NEGATIVE NEGATIVE   Ketones, ur NEGATIVE NEGATIVE mg/dL   Protein, ur NEGATIVE NEGATIVE mg/dL   Nitrite NEGATIVE NEGATIVE   Leukocytes, UA NEGATIVE NEGATIVE  Pregnancy, urine POC     Status: None   Collection Time: 07/22/18  9:49 PM  Result Value Ref Range   Preg Test, Ur NEGATIVE NEGATIVE  CBC with Differential/Platelet     Status: Abnormal   Collection Time: 07/22/18 10:59 PM  Result Value Ref Range   WBC 14.6 (H) 4.0 - 10.5 K/uL   RBC 4.26 3.87 - 5.11 MIL/uL   Hemoglobin 12.6 12.0 - 15.0 g/dL   HCT 40.9 81.1 - 91.4 %   MCV 87.8 80.0 -  100.0 fL   MCH 29.6 26.0 - 34.0 pg   MCHC 33.7 30.0 - 36.0 g/dL   RDW 78.2 95.6 - 21.3 %   Platelets 227 150 - 400 K/uL   nRBC 0.0 0.0 - 0.2 %   Neutrophils Relative % 84 %   Neutro Abs 12.1 (H) 1.7 - 7.7 K/uL   Lymphocytes Relative 12 %   Lymphs Abs 1.8 0.7 -  4.0 K/uL   Monocytes Relative 4 %   Monocytes Absolute 0.6 0.1 - 1.0 K/uL   Eosinophils Relative 0 %   Eosinophils Absolute 0.0 0.0 - 0.5 K/uL   Basophils Relative 0 %   Basophils Absolute 0.0 0.0 - 0.1 K/uL  Comprehensive metabolic panel     Status: Abnormal   Collection Time: 07/22/18 10:59 PM  Result Value Ref Range   Sodium 138 135 - 145 mmol/L   Potassium 4.0 3.5 - 5.1 mmol/L   Chloride 106 98 - 111 mmol/L   CO2 24 22 - 32 mmol/L   Glucose, Bld 104 (H) 70 - 99 mg/dL   BUN 18 6 - 20 mg/dL   Creatinine, Ser 1.61 0.44 - 1.00 mg/dL   Calcium 8.8 (L) 8.9 - 10.3 mg/dL   Total Protein 6.6 6.5 - 8.1 g/dL   Albumin 3.8 3.5 - 5.0 g/dL   AST 76 (H) 15 - 41 U/L   ALT 51 (H) 0 - 44 U/L   Alkaline Phosphatase 64 38 - 126 U/L   Total Bilirubin 0.5 0.3 - 1.2 mg/dL   GFR calc non Af Amer >60 >60 mL/min   GFR calc Af Amer >60 >60 mL/min   Anion gap 8 5 - 15    IMAGING Ct Abdomen Pelvis W Contrast  Result Date: 07/23/2018 CLINICAL DATA:  Right lower quadrant pain, elevated white cell count, and fever since 6 p.m. EXAM: CT ABDOMEN AND PELVIS WITH CONTRAST TECHNIQUE: Multidetector CT imaging of the abdomen and pelvis was performed using the standard protocol following bolus administration of intravenous contrast. CONTRAST:  ISOVUE-300 IOPAMIDOL (ISOVUE-300) INJECTION 61% COMPARISON:  None. FINDINGS: Lower chest: Lung bases are clear. Hepatobiliary: No focal liver abnormality is seen. No gallstones, gallbladder wall thickening, or biliary dilatation. Pancreas: Unremarkable. No pancreatic ductal dilatation or surrounding inflammatory changes. Spleen: Normal in size without focal abnormality. Adrenals/Urinary Tract: Adrenal glands  are unremarkable. Kidneys are normal, without renal calculi, focal lesion, or hydronephrosis. Bladder is unremarkable. Stomach/Bowel: Stomach, small bowel, and colon are not abnormally distended. No wall thickening or inflammatory changes identified. Scattered stool in the colon. The appendix is normal. Vascular/Lymphatic: No significant vascular findings are present. No enlarged abdominal or pelvic lymph nodes. Reproductive: Uterus is anteverted. An intrauterine device is present with appropriate location suggested. Somewhat complex appearing cyst in the right ovary measuring about 3.3 cm diameter, likely representing a functional hemorrhagic cyst. Small amount of free fluid in the pelvis is likely physiologic. Prominent gonadal veins. Other: No free air in the abdomen. Abdominal wall musculature appears intact. Musculoskeletal: No acute or significant osseous findings. IMPRESSION: No evidence of bowel obstruction or inflammation. Appendix is normal. Probable functional hemorrhagic cyst in the right ovary with small amount of free fluid in the pelvis, likely physiologic. Intrauterine device is present. Electronically Signed   By: Burman Nieves M.D.   On: 07/23/2018 02:59   US Pelvic Complete With Transvaginal  Result Date: 07/23/2018 CLINICAL DATA:  Spotting and cramping EXAM: TRANSABDOMINAL AND TRANSVAGINAL ULTRASOUND OF PELVIS TECHNIQUE: Both transabdominal and transvaginal ultrasound examinations of the pelvis were performed. Transabdominal technique was performed for global imaging of the pelvis including uterus, ovaries, adnexal regions, and pelvic cul-de-sac. It was necessary to proceed with endovaginal exam following the transabdominal exam to visualize the uterus endometrium and ovaries. COMPARISON:  None FINDINGS: Uterus Measurements: 7.9 x 2.9 x 4.8 cm. No fibroids or other mass visualized. Endometrium Thickness: 2.9 mm. Tiny fluid in the endometrial canal. Intrauterine device  is present within  the uterus. Right ovary Measurements: 4.6 x 2.8 x 2.6 cm. Normal appearance/no adnexal mass. Left ovary Measurements: 3 x 2.1 x 2.1 cm.  Normal appearance/no adnexal mass. Other findings Trace free fluid in the pelvis IMPRESSION: Intrauterine device appears grossly appropriate in position by sonography. Trace free fluid in the pelvis. Otherwise negative Electronically Signed   By: Jasmine Pang M.D.   On: 07/23/2018 00:10    MAU COURSE Orders Placed This Encounter  Procedures  . US PELVIC COMPLETE WITH TRANSVAGINAL  . CT ABDOMEN PELVIS W CONTRAST  . Urinalysis, Routine w reflex microscopic  . CBC with Differential/Platelet  . Comprehensive metabolic panel  . Pregnancy, urine POC  . Discharge patient   Meds ordered this encounter  Medications  . HYDROmorphone (DILAUDID) injection 1 mg  . promethazine (PHENERGAN) injection 12.5 mg  . iopamidol (ISOVUE-300) 61 % injection 30 mL  . iopamidol (ISOVUE-300) 61 % injection 100 mL    MDM UPT negative Afebrile in MAU but did take tylenol & naproxen PTA Pelvic ultrasound ordered & normal. IUD in place per u/s & strings visualized on pelvic exam.  CBC with leukocytosis. In presence of fever at home & lower abdominal pain that is worse in RLQ will proceed with CT.  CT shows 3 cm right ovarian cyst that was not seen on ultrasound. Otherwise normal.   CMT drawn for CT -- mildly elevated LFTs. Normal liver on CT & pt denies upper abdominal pain. ?d/t recent tylenol usage. C/w Dr. Vergie Living. Patient has PCP at [facility] in [place]. Pt to f/u with PCP to reassess LFTs.   Pain improved with IM dilaudid. Discussed with patient if pain continues, to f/u with gyn. If upper abdominal pain or vomiting starts, go to ED.   ASSESSMENT 1. Right ovarian cyst   2. Pelvic pain in female   3. Elevated LFTs   4. IUD (intrauterine device) in place     PLAN Discharge home in stable condition. D/c tylenol until f/u with PCP  Follow-up Information     Lonie Peak, PA-C. Schedule an appointment as soon as possible for a visit.   Specialty:  Physician Assistant Why:  to reassess liver function test Contact information: [address] [place] [address] [phone]        Ob/Gyn, Nestor Ramp Follow up.   Why:  Follow up as needed for pelvic pain Contact information: [address] [place] [address] [phone]          Allergies as of 07/23/2018      Reactions   Sulfa Antibiotics Hives, Rash      Medication List    STOP taking these medications   oxyCODONE-acetaminophen 5-325 MG tablet Commonly known as:  PERCOCET/ROXICET     TAKE these medications   MIRENA (52 MG) 20 MCG/24HR IUD Generic drug:  levonorgestrel Mirena 20 mcg/24 hours (5 yrs) 52 mg intrauterine device  Take 1 device by intrauterine route.        Judeth Horn, NP 07/23/2018  8:46 AM

## 2018-07-22 NOTE — MAU Note (Signed)
Pt presents to MAU c/o abdominal pain that started tonight around 1800 along with vaginal spotting, pt thought she was starting her period but the pain worsened and she developed a fever of 100.5. Pt called the on call nurse and was instructed to take 500mg  naproxen and tylenol 1000mg . Pt took those at 1930. Pt reports last sex was on Saturday.  Pt states she delivered by c/s on march 21st. Pt has IUD.

## 2018-07-23 ENCOUNTER — Inpatient Hospital Stay (HOSPITAL_COMMUNITY): Payer: BLUE CROSS/BLUE SHIELD

## 2018-07-23 DIAGNOSIS — R1031 Right lower quadrant pain: Secondary | ICD-10-CM | POA: Diagnosis not present

## 2018-07-23 DIAGNOSIS — N83201 Unspecified ovarian cyst, right side: Secondary | ICD-10-CM | POA: Diagnosis not present

## 2018-07-23 DIAGNOSIS — N939 Abnormal uterine and vaginal bleeding, unspecified: Secondary | ICD-10-CM | POA: Diagnosis not present

## 2018-07-23 LAB — COMPREHENSIVE METABOLIC PANEL
ALT: 51 U/L — ABNORMAL HIGH (ref 0–44)
AST: 76 U/L — ABNORMAL HIGH (ref 15–41)
Albumin: 3.8 g/dL (ref 3.5–5.0)
Alkaline Phosphatase: 64 U/L (ref 38–126)
Anion gap: 8 (ref 5–15)
BILIRUBIN TOTAL: 0.5 mg/dL (ref 0.3–1.2)
BUN: 18 mg/dL (ref 6–20)
CO2: 24 mmol/L (ref 22–32)
Calcium: 8.8 mg/dL — ABNORMAL LOW (ref 8.9–10.3)
Chloride: 106 mmol/L (ref 98–111)
Creatinine, Ser: 0.69 mg/dL (ref 0.44–1.00)
Glucose, Bld: 104 mg/dL — ABNORMAL HIGH (ref 70–99)
POTASSIUM: 4 mmol/L (ref 3.5–5.1)
Sodium: 138 mmol/L (ref 135–145)
TOTAL PROTEIN: 6.6 g/dL (ref 6.5–8.1)

## 2018-07-23 MED ORDER — IOPAMIDOL (ISOVUE-300) INJECTION 61%
30.0000 mL | INTRAVENOUS | Status: AC
  Administered 2018-07-23 (×2): 30 mL via ORAL

## 2018-07-23 MED ORDER — IOPAMIDOL (ISOVUE-300) INJECTION 61%
100.0000 mL | Freq: Once | INTRAVENOUS | Status: AC | PRN
  Administered 2018-07-23: 100 mL via INTRAVENOUS

## 2018-07-23 NOTE — Discharge Instructions (Signed)
Your liver function test were slightly elevated tonight. Do not take medication with acetaminophen in it until you follow up with your primary care doctor.     Ovarian Cyst An ovarian cyst is a fluid-filled sac that forms on an ovary. The ovaries are small organs that produce eggs in women. Various types of cysts can form on the ovaries. Some may cause symptoms and require treatment. Most ovarian cysts go away on their own, are not cancerous (are benign), and do not cause problems. Common types of ovarian cysts include:  Functional (follicle) cysts. ? Occur during the menstrual cycle, and usually go away with the next menstrual cycle if you do not get pregnant. ? Usually cause no symptoms.  Endometriomas. ? Are cysts that form from the tissue that lines the uterus (endometrium). ? Are sometimes called chocolate cysts because they become filled with blood that turns brown. ? Can cause pain in the lower abdomen during intercourse and during your period.  Cystadenoma cysts. ? Develop from cells on the outside surface of the ovary. ? Can get very large and cause lower abdomen pain and pain with intercourse. ? Can cause severe pain if they twist or break open (rupture).  Dermoid cysts. ? Are sometimes found in both ovaries. ? May contain different kinds of body tissue, such as skin, teeth, hair, or cartilage. ? Usually do not cause symptoms unless they get very big.  Theca lutein cysts. ? Occur when too much of a certain hormone (human chorionic gonadotropin) is produced and overstimulates the ovaries to produce an egg. ? Are most common after having procedures used to assist with the conception of a baby (in vitro fertilization).  What are the causes? Ovarian cysts may be caused by:  Ovarian hyperstimulation syndrome. This is a condition that can develop from taking fertility medicines. It causes multiple large ovarian cysts to form.  Polycystic ovarian syndrome (PCOS). This is a  common hormonal disorder that can cause ovarian cysts, as well as problems with your period or fertility.  What increases the risk? The following factors may make you more likely to develop ovarian cysts:  Being overweight or obese.  Taking fertility medicines.  Taking certain forms of hormonal birth control.  Smoking.  What are the signs or symptoms? Many ovarian cysts do not cause symptoms. If symptoms are present, they may include:  Pelvic pain or pressure.  Pain in the lower abdomen.  Pain during sex.  Abdominal swelling.  Abnormal menstrual periods.  Increasing pain with menstrual periods.  How is this diagnosed? These cysts are commonly found during a routine pelvic exam. You may have tests to find out more about the cyst, such as:  Ultrasound.  X-ray of the pelvis.  CT scan.  MRI.  Blood tests.  How is this treated? Many ovarian cysts go away on their own without treatment. Your health care provider may want to check your cyst regularly for 2-3 months to see if it changes. If you are in menopause, it is especially important to have your cyst monitored closely because menopausal women have a higher rate of ovarian cancer. When treatment is needed, it may include:  Medicines to help relieve pain.  A procedure to drain the cyst (aspiration).  Surgery to remove the whole cyst.  Hormone treatment or birth control pills. These methods are sometimes used to help dissolve a cyst.  Follow these instructions at home:  Take over-the-counter and prescription medicines only as told by your health care provider.  Do not drive or use heavy machinery while taking prescription pain medicine.  Get regular pelvic exams and Pap tests as often as told by your health care provider.  Return to your normal activities as told by your health care provider. Ask your health care provider what activities are safe for you.  Do not use any products that contain nicotine or  tobacco, such as cigarettes and e-cigarettes. If you need help quitting, ask your health care provider.  Keep all follow-up visits as told by your health care provider. This is important. Contact a health care provider if:  Your periods are late, irregular, or painful, or they stop.  You have pelvic pain that does not go away.  You have pressure on your bladder or trouble emptying your bladder completely.  You have pain during sex.  You have any of the following in your abdomen: ? A feeling of fullness. ? Pressure. ? Discomfort. ? Pain that does not go away. ? Swelling.  You feel generally ill.  You become constipated.  You lose your appetite.  You develop severe acne.  You start to have more body hair and facial hair.  You are gaining weight or losing weight without changing your exercise and eating habits.  You think you may be pregnant. Get help right away if:  You have abdominal pain that is severe or gets worse.  You cannot eat or drink without vomiting.  You suddenly develop a fever.  Your menstrual period is much heavier than usual. This information is not intended to replace advice given to you by your health care provider. Make sure you discuss any questions you have with your health care provider. Document Released: 09/25/2005 Document Revised: 04/14/2016 Document Reviewed: 02/27/2016 Elsevier Interactive Patient Education  [employer].

## 2018-07-26 DIAGNOSIS — Z23 Encounter for immunization: Secondary | ICD-10-CM | POA: Diagnosis not present

## 2018-08-01 DIAGNOSIS — E039 Hypothyroidism, unspecified: Secondary | ICD-10-CM | POA: Diagnosis not present

## 2018-09-26 DIAGNOSIS — N939 Abnormal uterine and vaginal bleeding, unspecified: Secondary | ICD-10-CM | POA: Diagnosis not present

## 2018-09-26 DIAGNOSIS — Z6828 Body mass index (BMI) 28.0-28.9, adult: Secondary | ICD-10-CM | POA: Diagnosis not present

## 2019-04-17 DIAGNOSIS — R51 Headache: Secondary | ICD-10-CM | POA: Diagnosis not present

## 2019-04-28 DIAGNOSIS — E039 Hypothyroidism, unspecified: Secondary | ICD-10-CM | POA: Diagnosis not present

## 2019-04-28 DIAGNOSIS — Z6830 Body mass index (BMI) 30.0-30.9, adult: Secondary | ICD-10-CM | POA: Diagnosis not present

## 2019-04-28 DIAGNOSIS — Z124 Encounter for screening for malignant neoplasm of cervix: Secondary | ICD-10-CM | POA: Diagnosis not present

## 2019-04-28 DIAGNOSIS — Z01419 Encounter for gynecological examination (general) (routine) without abnormal findings: Secondary | ICD-10-CM | POA: Diagnosis not present

## 2019-06-12 DIAGNOSIS — E039 Hypothyroidism, unspecified: Secondary | ICD-10-CM | POA: Diagnosis not present

## 2019-06-23 DIAGNOSIS — E039 Hypothyroidism, unspecified: Secondary | ICD-10-CM | POA: Diagnosis not present

## 2019-08-02 IMAGING — US US MFM OB TRANSVAGINAL
1 series · 15 of 26 positions shown · non-contrast
Comparison: none

[Series 1: us mfm ob transvaginal · 26 acquisitions, 15 frames shown]
[im 1/26]
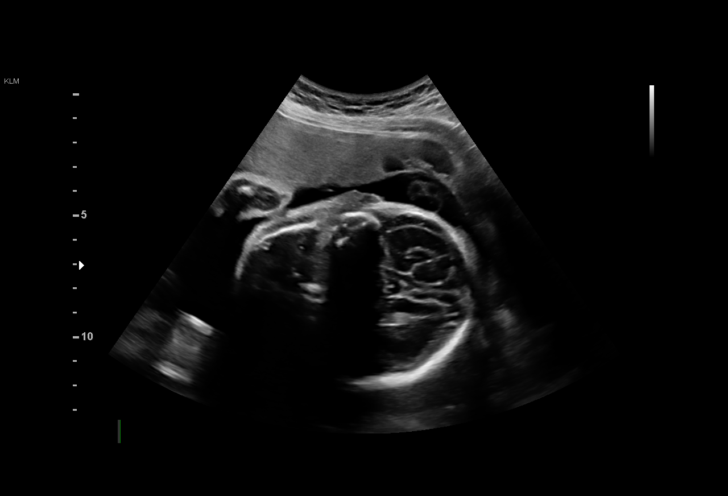
[im 3/26]
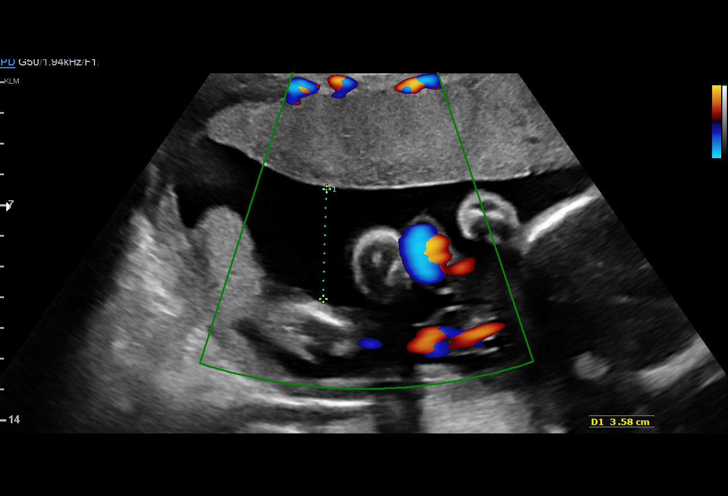
[im 5/26]
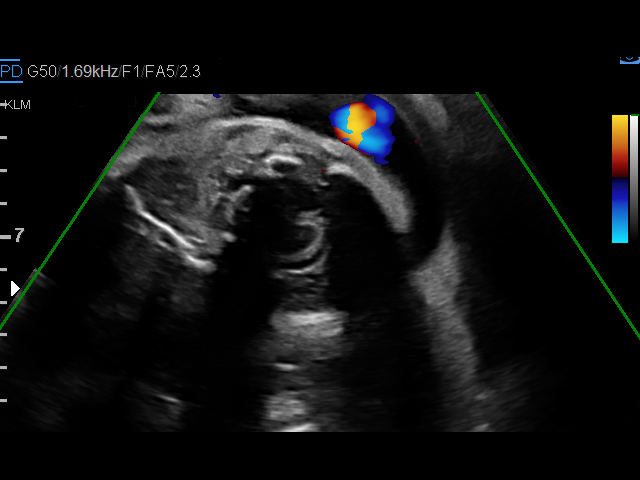
[im 7/26]
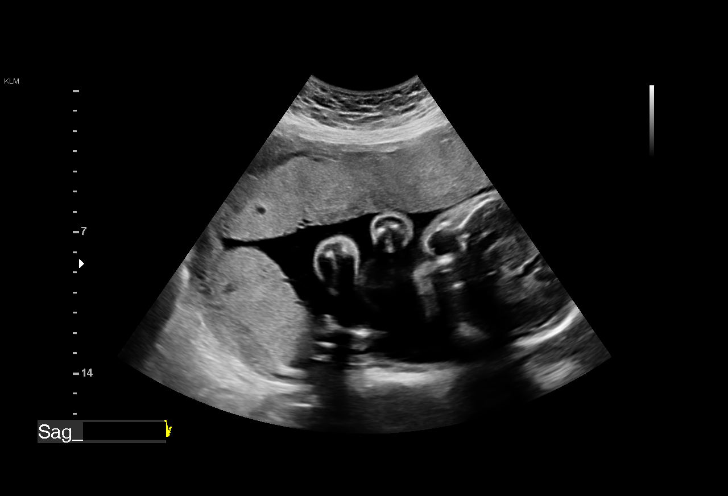
[im 8/26]
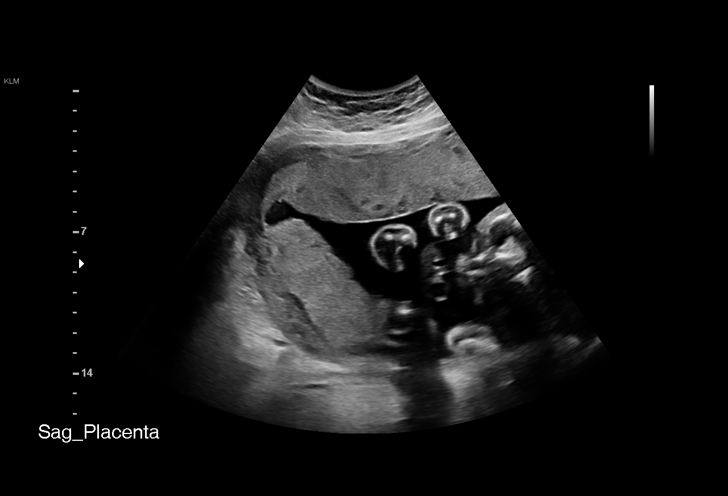
[im 10/26]
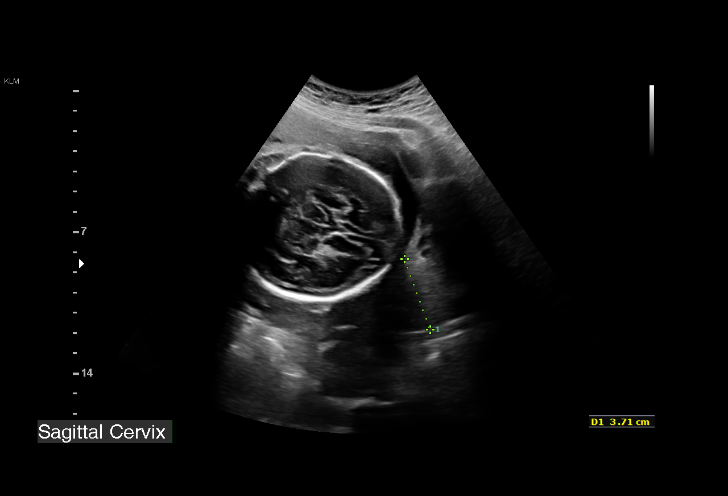
[im 12/26]
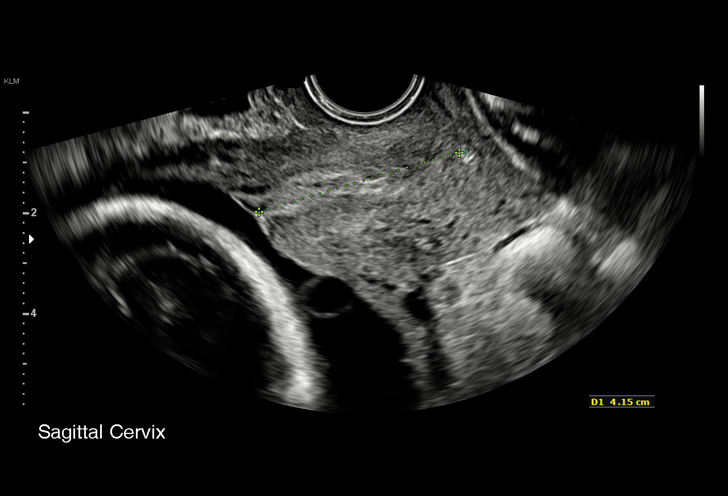
[im 14/26]
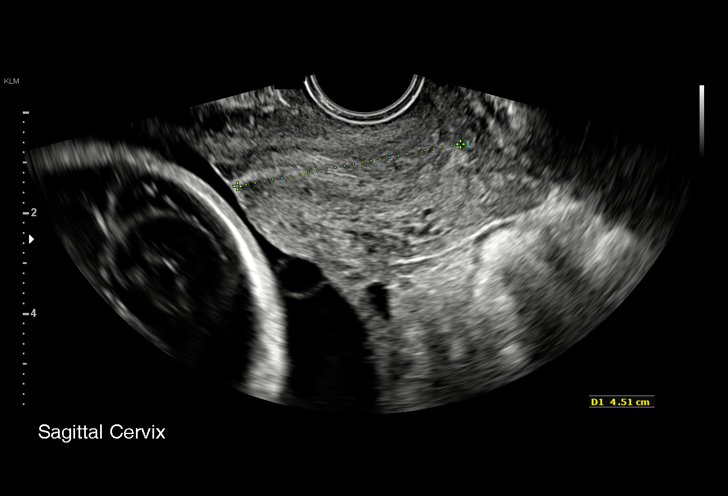
[im 15/26]
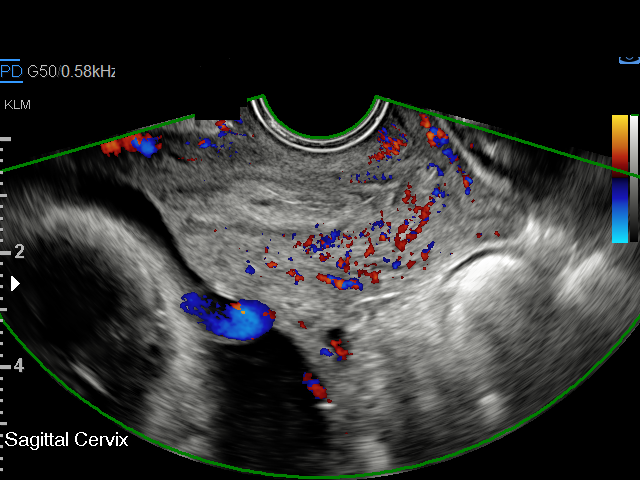
[im 17/26]
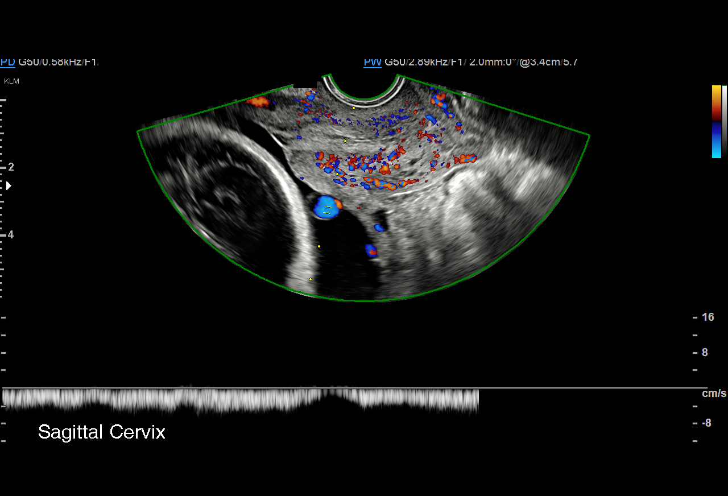
[im 19/26]
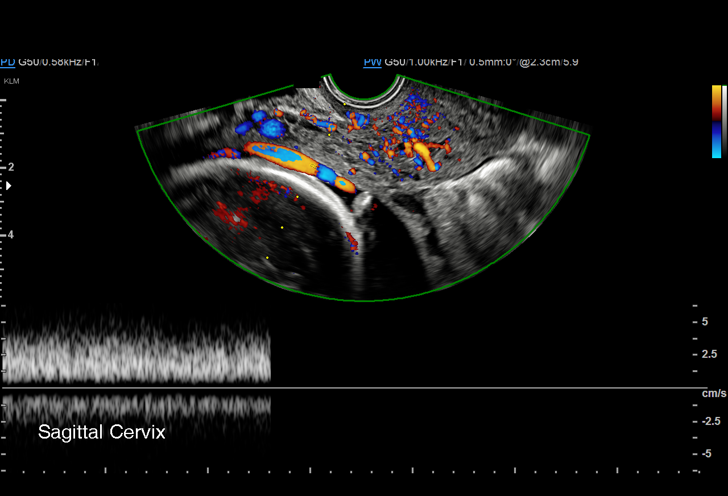
[im 20/26]
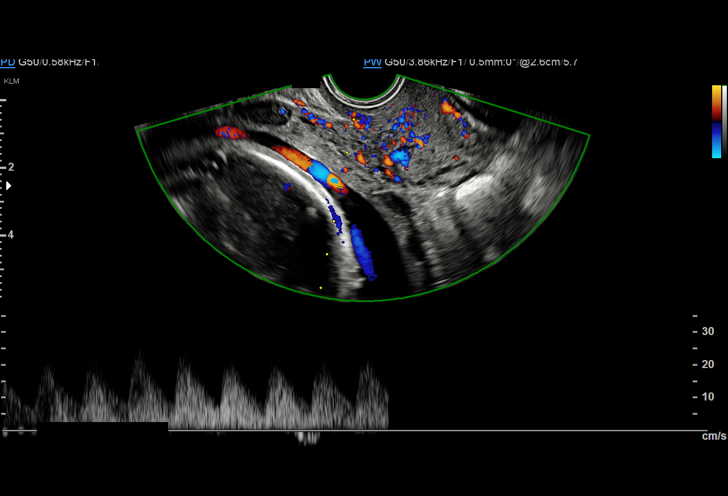
[im 22/26]
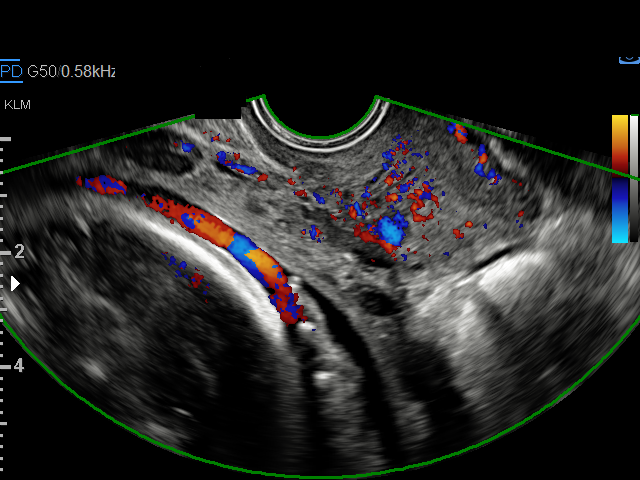
[im 24/26]
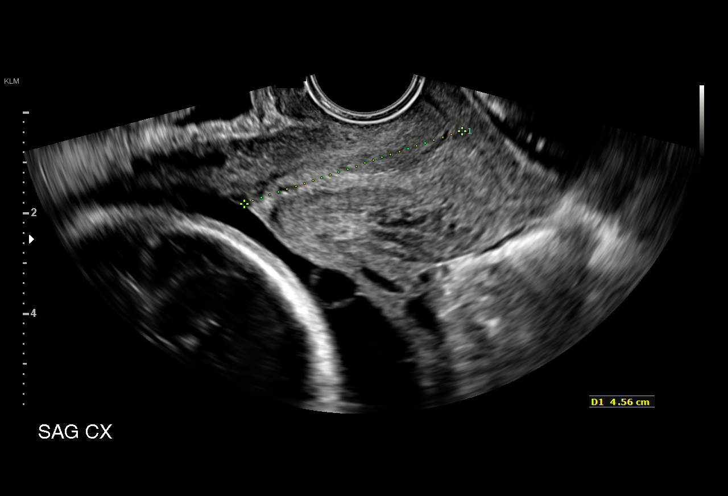
[im 26/26]
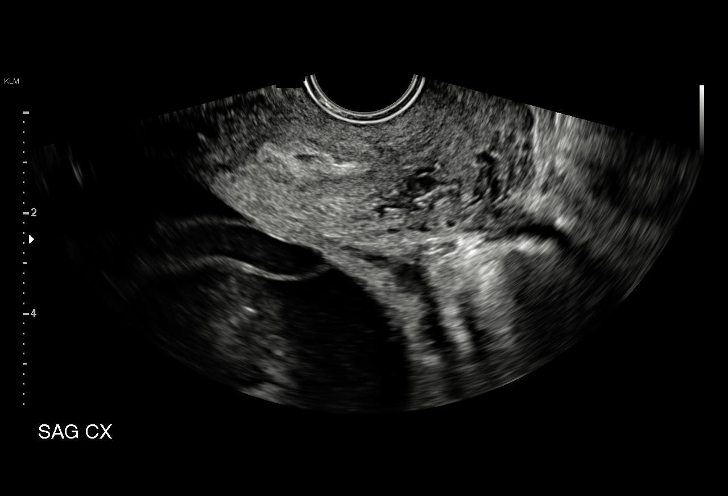

[15 of 26 positions shown; findings below may reference images not displayed]

1  GODARZI RUTA            [id number]      [id number]     [id number]
Indications

26 weeks gestation of pregnancy
Vasa previa
OB History

Gravidity:    3         Term:   1        Prem:   0        SAB:   1
TOP:          0       Ectopic:  0        Living: 1
Fetal Evaluation

Num Of Fetuses:     1
Fetal Heart         140
Rate(bpm):
Cardiac Activity:   Observed
Presentation:       Cephalic
Placenta:           Anterior with posterior succenturiate
P. Cord Insertion:  Visualized

Amniotic Fluid
AFI FV:      Subjectively within normal limits
Gestational Age

LMP:           29w 4d        Date:  04/01/17                 EDD:   01/06/18
Best:          26w 6d     Det. By:  Early Ultrasound         EDD:   01/25/18
(06/21/17)
Cervix Uterus Adnexa

Cervix
Length:            4.2  cm.
Normal appearance by transabdominal scan.
Impression

SIUP at 26+6 weeks
Normal amniotic fluid volume
EV views of cervix: Anterior placenta with right, upper
posterior succenturiate lobe; velamentous cord insertion into
anterior LUS (below placenta) - vein and artery then travel
down and transverses cervix i.e. vasa previa

The US findings were shared with Ms. Sujay and her
husband. The implications of a vasa previa were discussed in
detail. If the vessels remain in their current location, would
suggest admission to hospital 
 32 weeks and consider
delivery between 34 and 35 weeks. BMZ course prior to
delivery. Precautions given.
Recommendations

Follow-up ultrasound for cervical length and reassessement
of fetal vessels every 2 weeks

## 2019-08-04 DIAGNOSIS — E039 Hypothyroidism, unspecified: Secondary | ICD-10-CM | POA: Diagnosis not present

## 2019-08-16 IMAGING — US US MFM OB FOLLOW-UP
1 series · 14 of 28 positions shown · non-contrast
Comparison: none

[Series 1: us mfm ob follow-up · 85 acquisitions, 14 frames shown]
[im 4/85]
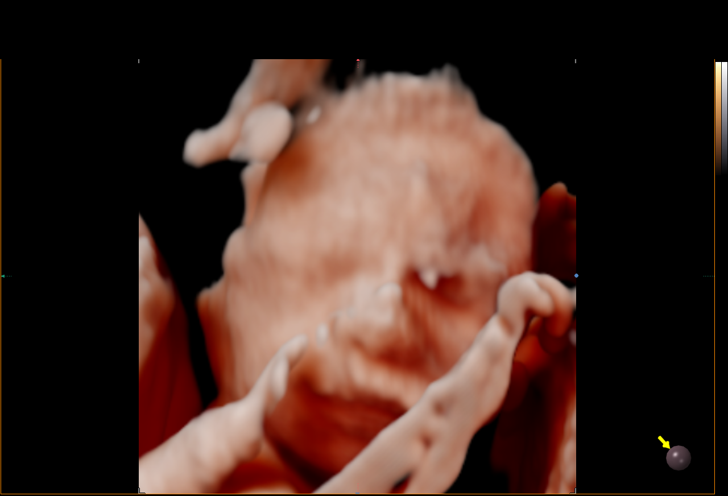
[im 10/85]
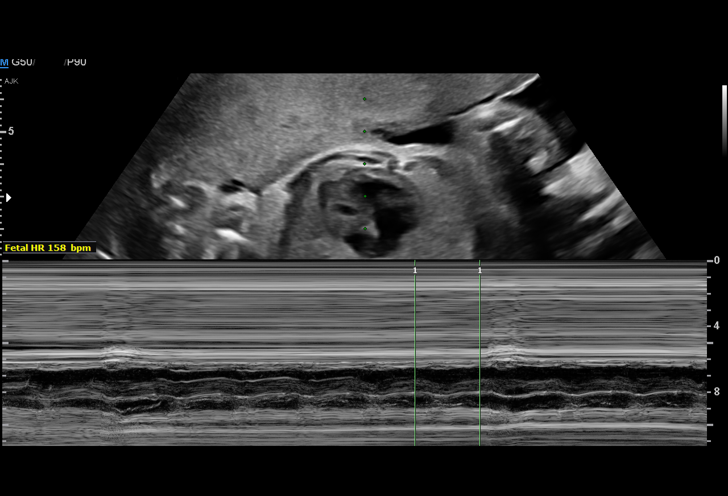
[im 16/85]
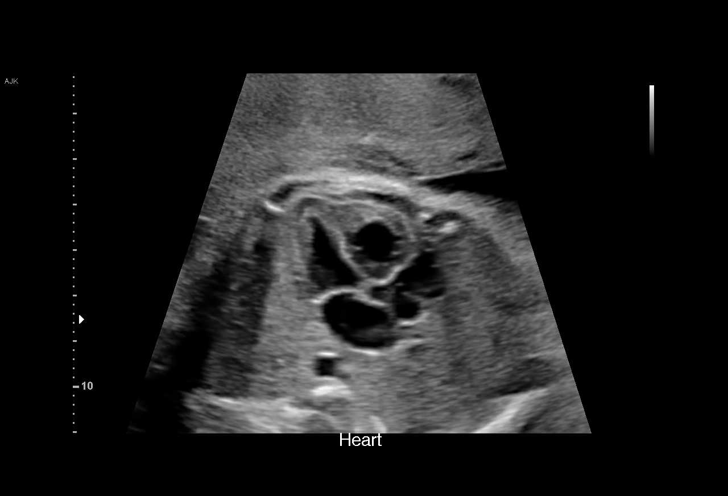
[im 22/85]
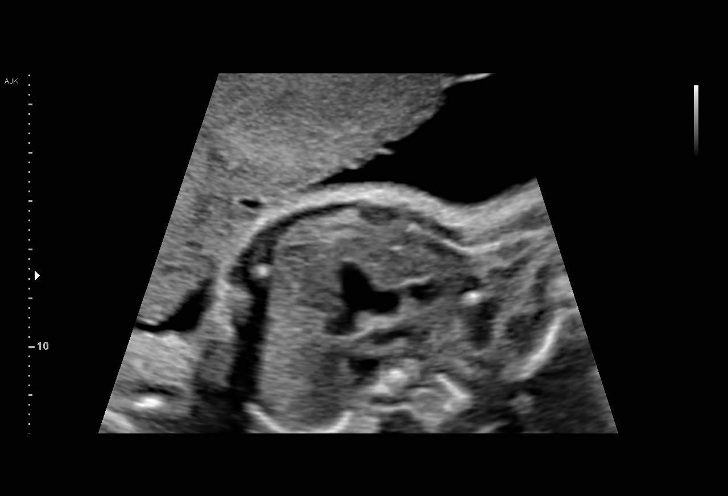
[im 29/85]
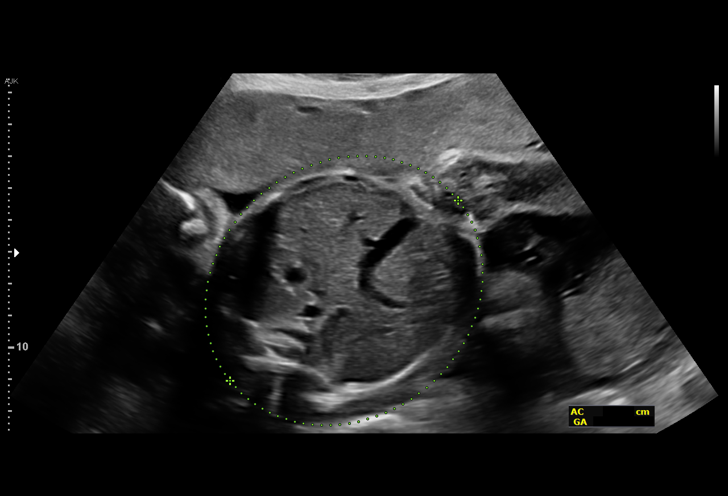
[im 35/85]
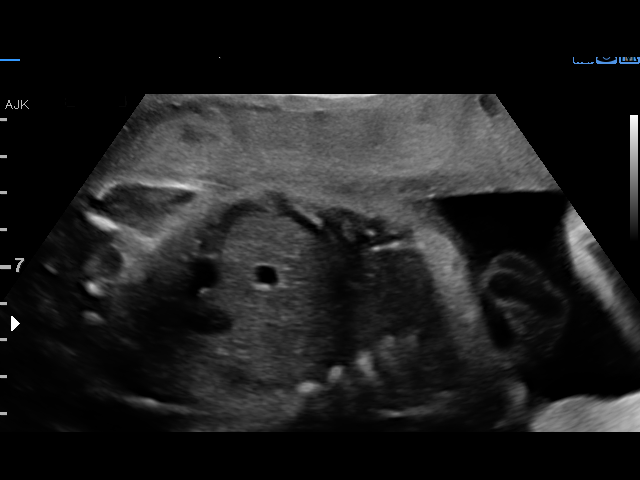
[im 41/85]
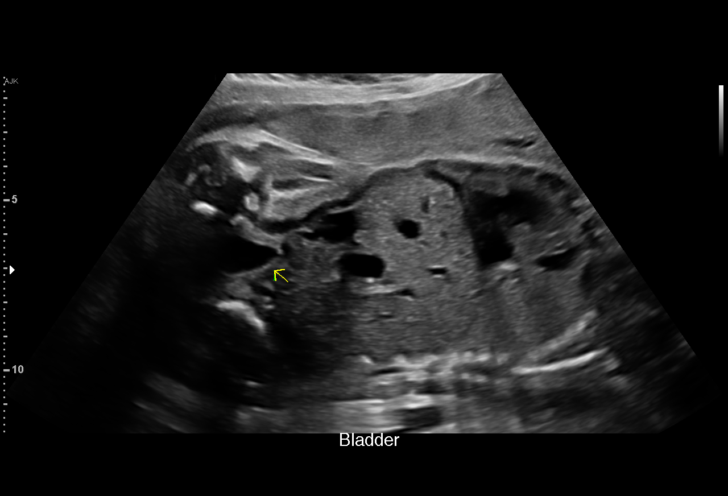
[im 47/85]
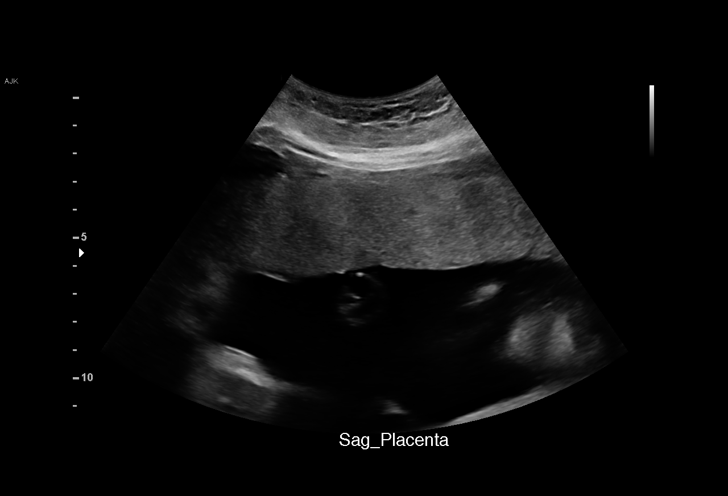
[im 53/85]
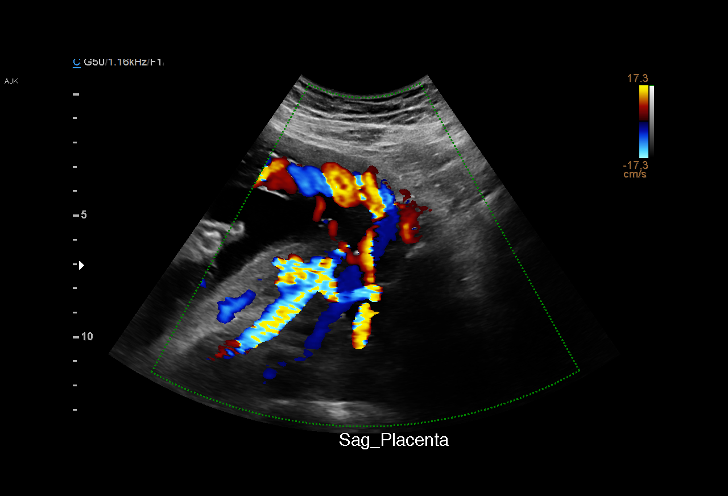
[im 60/85]
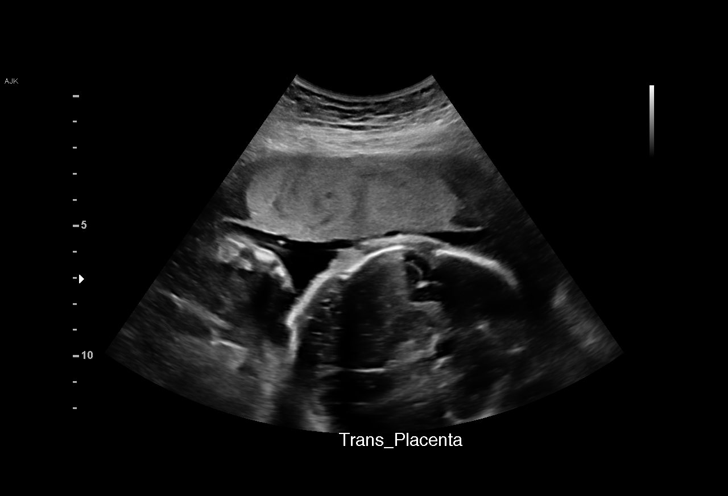
[im 66/85]
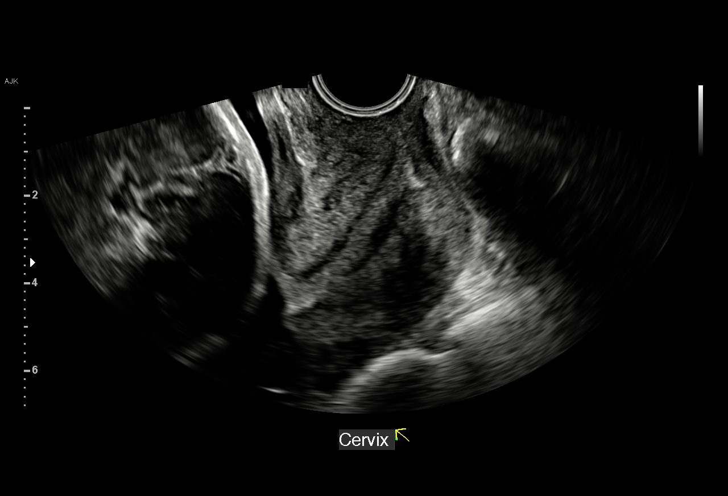
[im 72/85]
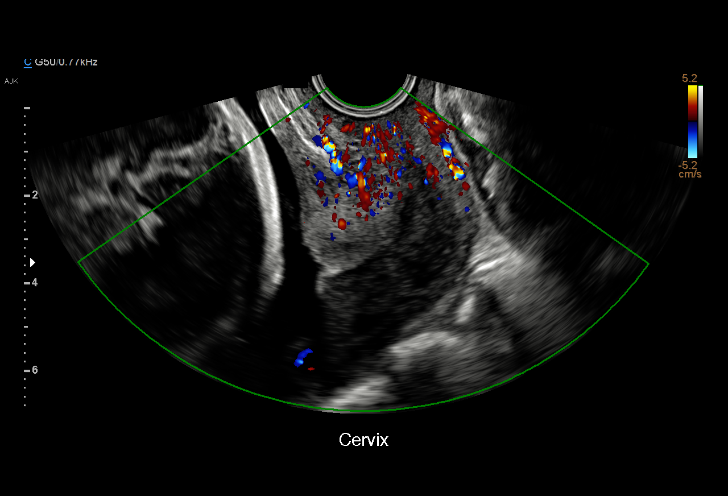
[im 78/85]
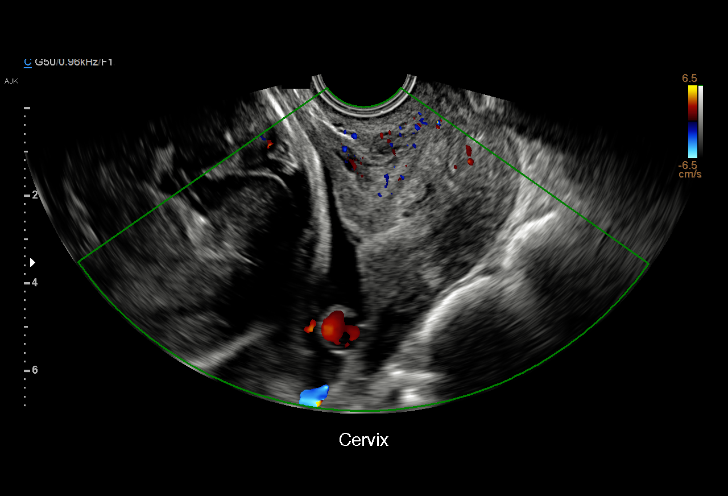
[im 85/85]
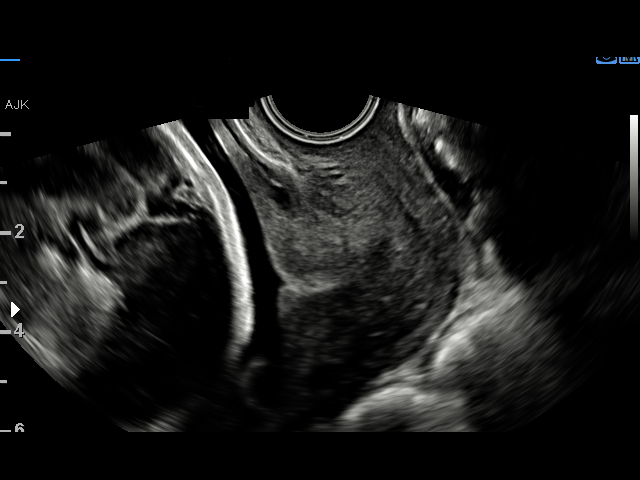

[14 of 28 positions shown; findings below may reference images not displayed]

1  SEM-MARC ARMONAIZE            [id number]      [id number]     [id number]
2  SEM-MARC ARMONAIZE            [id number]      [id number]     [id number]
Indications

28 weeks gestation of pregnancy
Vasa previa
Antenatal follow-up for nonvisualized fetal
anatomy
OB History

Gravidity:    3         Term:   1        Prem:   0        SAB:   1
TOP:          0       Ectopic:  0        Living: 1
Fetal Evaluation

Num Of Fetuses:     1
Fetal Heart         158
Rate(bpm):
Cardiac Activity:   Observed
Presentation:       Cephalic
Placenta:           Anterior with posterior succenturiate
P. Cord Insertion:  Visualized

Amniotic Fluid
AFI FV:      Subjectively within normal limits

AFI Sum(cm)     %Tile       Largest Pocket(cm)
10.83           19

RUQ(cm)       RLQ(cm)       LUQ(cm)        LLQ(cm)
2.47
Biometry

BPD:        78  mm     G. Age:  31w 2d         96  %    CI:        74.79   %    70 - 86
FL/HC:      19.4   %    19.6 -
HC:      286.2  mm     G. Age:  31w 3d         88  %    HC/AC:      1.10        0.99 -
AC:      259.5  mm     G. Age:  30w 1d         79  %    FL/BPD:     71.2   %    71 - 87
FL:       55.5  mm     G. Age:  29w 2d         47  %    FL/AC:      21.4   %    20 - 24

Est. FW:    1941  gm      3 lb 5 oz     75  %
Gestational Age

LMP:           31w 4d        Date:  04/01/17                 EDD:   01/06/18
U/S Today:     30w 4d                                        EDD:   01/13/18
Best:          28w 6d     Det. By:  Early Ultrasound         EDD:   01/25/18
(06/21/17)
Anatomy

Cranium:               Appears normal         Aortic Arch:            Appears normal
Cavum:                 Appears normal         Ductal Arch:            Appears normal
Ventricles:            Appears normal         Diaphragm:              Appears normal
Choroid Plexus:        Previously seen        Stomach:                Appears normal, left
sided
Cerebellum:            Previously seen        Abdomen:                Appears normal
Posterior Fossa:       Previously seen        Abdominal Wall:         Previously seen
Nuchal Fold:           Not applicable (>20    Cord Vessels:           Previously seen
wks GA)
Face:                  Orbits and profile     Kidneys:                Appear normal
previously seen
Lips:                  Appears normal         Bladder:                Appears normal
Thoracic:              Appears normal         Spine:                  Previously seen
Heart:                 Appears normal         Upper Extremities:      Previously seen
(4CH, axis, and situs
RVOT:                  Appears normal         Lower Extremities:      Previously seen
LVOT:                  Appears normal

Other:  Fetus appears to be a male.Heels previously seen. Nasal bone
visualized.
Cervix Uterus Adnexa

Cervix
Length:           3.53  cm.
Normal appearance by transvaginal scan

Uterus
No abnormality visualized.

Left Ovary
Not visualized.

Right Ovary
Not visualized.
Impression

SIUP at 28+6 weeks
Normal interval anatomy; anatomic survey complete
Normal amniotic fluid volume
Appropriate interval growth with EFW at the 75th %tile
EV views of cervix: normal length without funneling;
previously seen fetal vessel running across internal os is now

 2 cms away; may not need to manage as vasa previa
Recommendations

Continue to follow location of aberrant fetal vessels every 2
weeks

## 2019-08-30 IMAGING — US US MFM OB TRANSVAGINAL
1 series · 15 of 28 positions shown · non-contrast
Comparison: none

[Series 1: us mfm ob transvaginal · 15 of 42 slices shown]
[im 1/42]
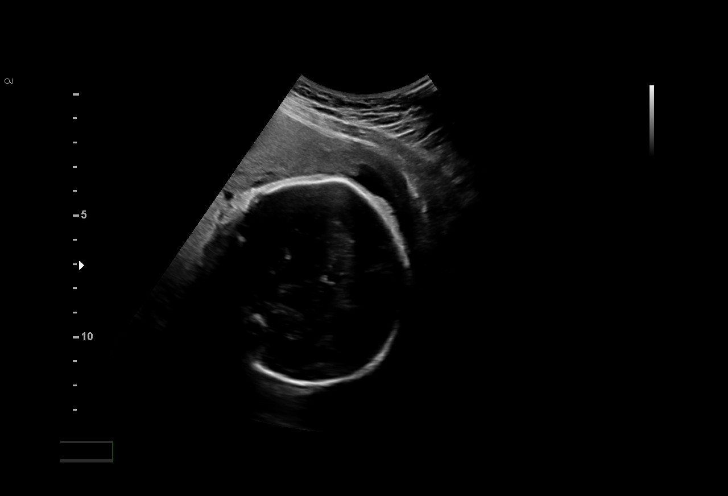
[im 4/42]
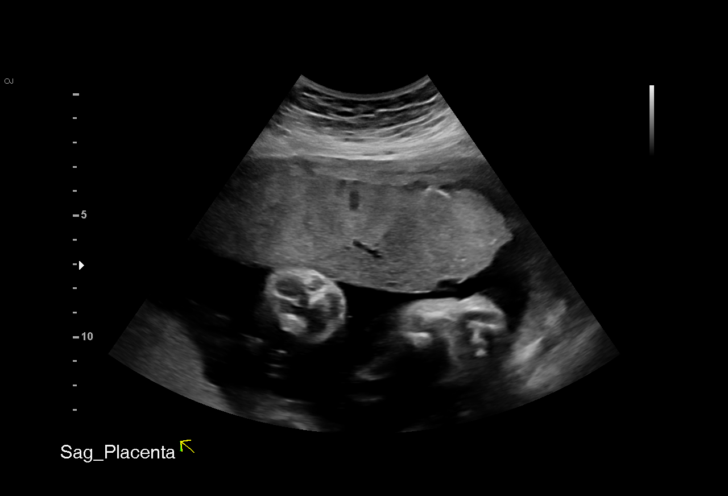
[im 7/42]
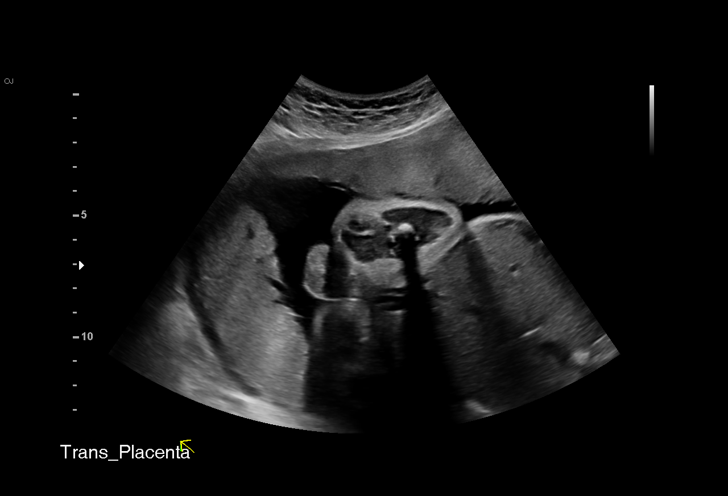
[im 10/42]
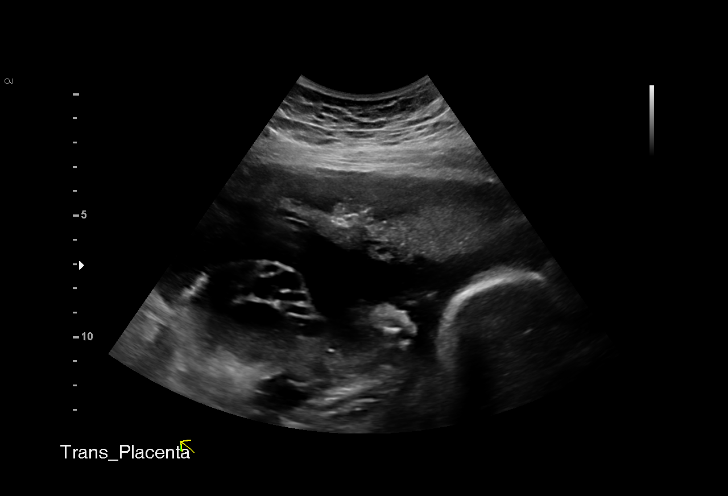
[im 13/42]
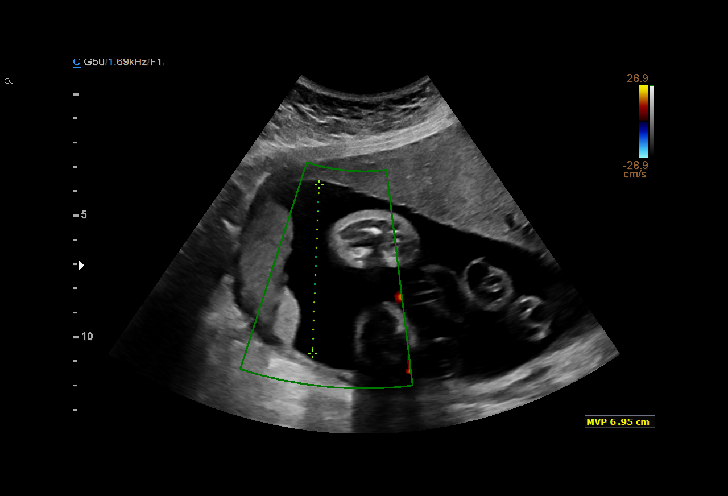
[im 16/42]
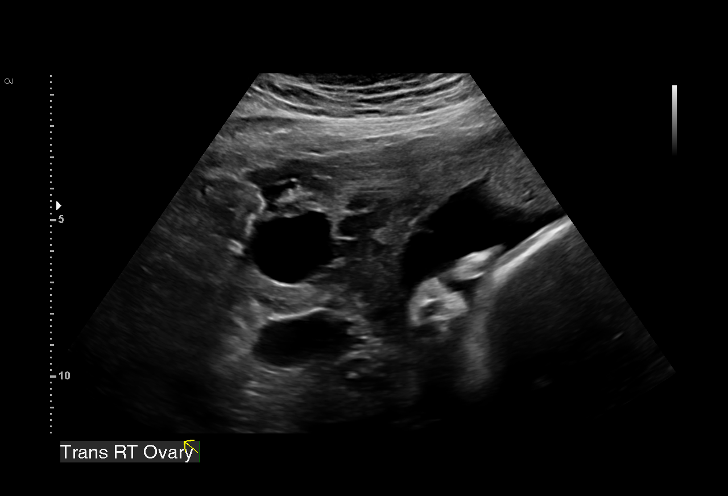
[im 19/42]
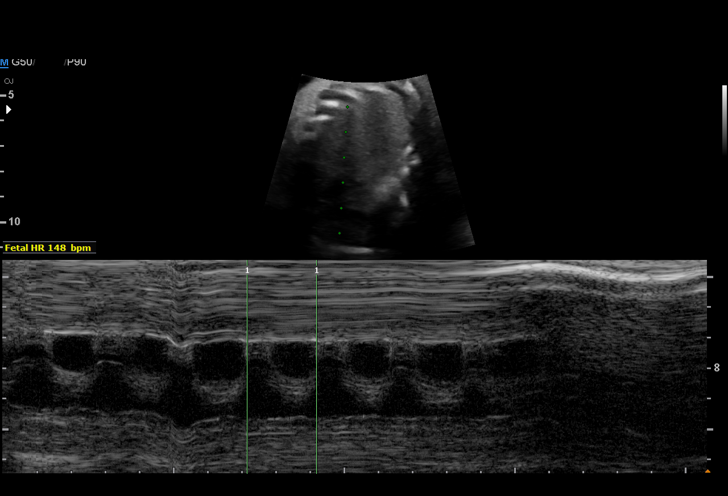
[im 22/42]
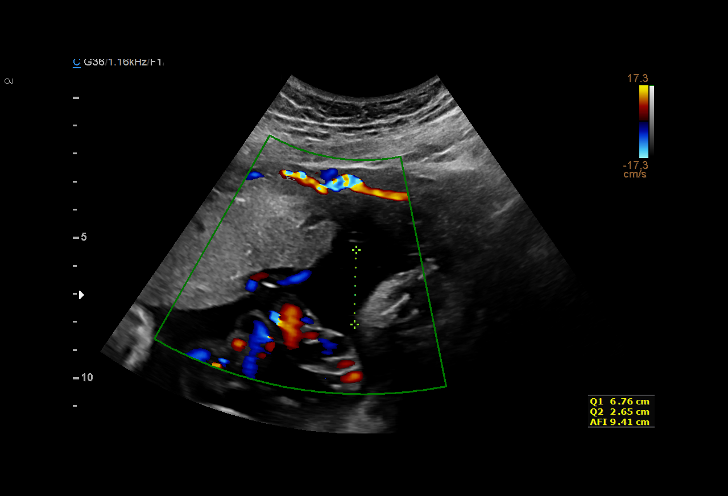
[im 23/42]
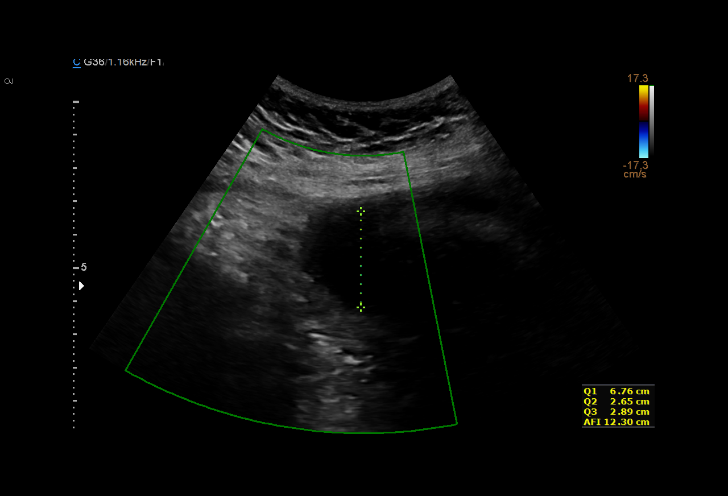
[im 26/42]
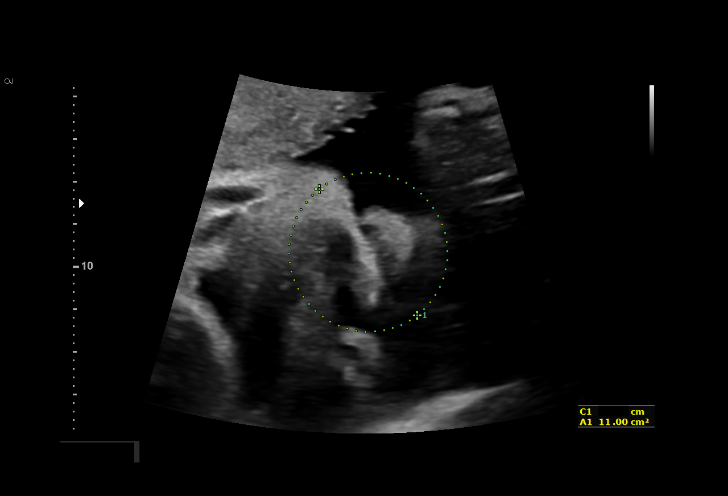
[im 29/42]
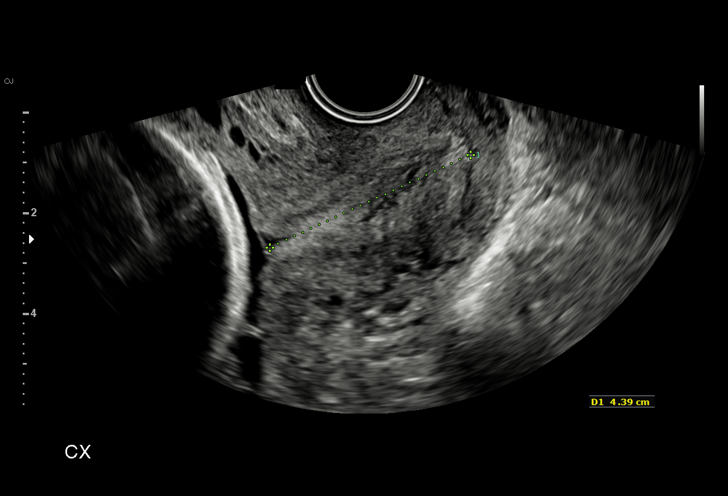
[im 32/42]
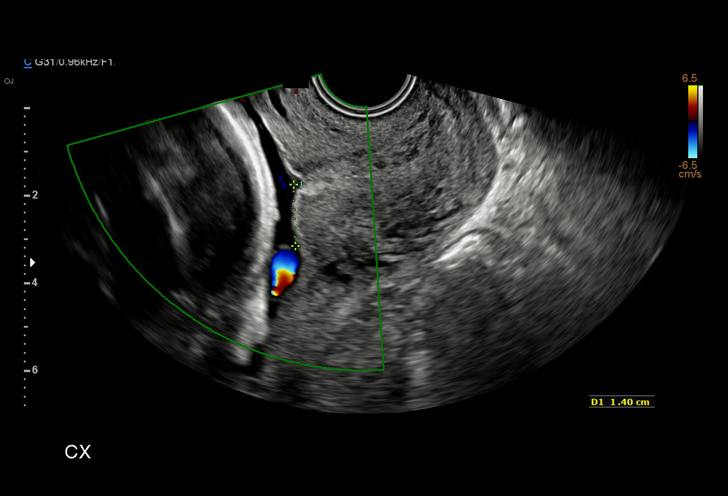
[im 35/42]
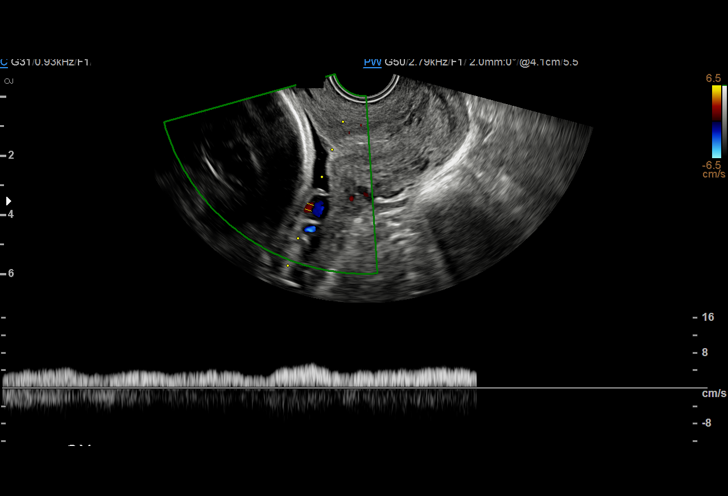
[im 38/42]
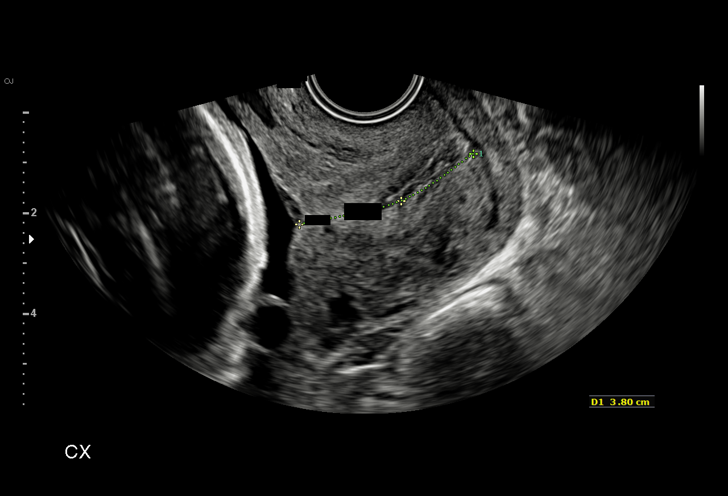
[im 42/42]
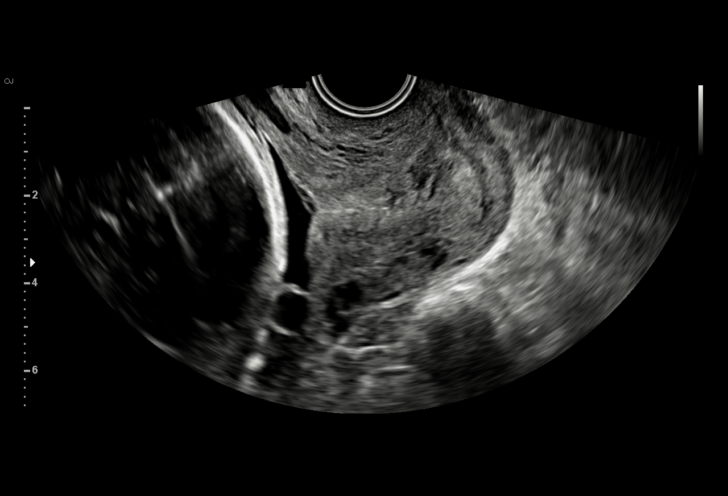

[15 of 28 positions shown; findings below may reference images not displayed]

1  HERKOV MAUKO            [id number]      [id number]     [id number]
Indications

30 weeks gestation of pregnancy
Vasa previa
OB History

Gravidity:    3         Term:   1        Prem:   0        SAB:   1
TOP:          0       Ectopic:  0        Living: 1
Fetal Evaluation

Num Of Fetuses:     1
Fetal Heart         148
Rate(bpm):
Cardiac Activity:   Observed
Presentation:       Cephalic
Placenta:           Anterior w posterior succenturiate lobe

Amniotic Fluid
AFI FV:      Subjectively within normal limits

AFI Sum(cm)     %Tile       Largest Pocket(cm)
16.01           58

RUQ(cm)       RLQ(cm)       LUQ(cm)        LLQ(cm)
6.76
Gestational Age

LMP:           33w 4d        Date:  04/01/17                 EDD:   01/06/18
Best:          30w 6d     Det. By:  Early Ultrasound         EDD:   01/25/18
(06/21/17)
Cervix Uterus Adnexa

Cervix
Length:            3.8  cm.
Normal appearance by transvaginal scan

Uterus
No abnormality visualized.

Left Ovary
Not visualized.

Right Ovary
Size(cm)       3.3  x   3.2    x  3.5       Vol(ml):
Within normal limits.

Cul De Sac:   No free fluid seen.
Impression

SIUP at 30+6 weeks
Normal amniotic fluid volume
EV views of cervix: normal length without funneling; again, no
fetal vessel seen coursing across the internal os; fetal vein
remains 
 1.5 - 2.0 cms from internal os (very similar in
appearance to the [DATE] study)
Recommendations

Continue to follow every 2 weeks - hopefully it will "move"
further away from cervix
Will present to MFM group

## 2019-11-11 DIAGNOSIS — R102 Pelvic and perineal pain: Secondary | ICD-10-CM | POA: Diagnosis not present

## 2019-11-11 DIAGNOSIS — Z6828 Body mass index (BMI) 28.0-28.9, adult: Secondary | ICD-10-CM | POA: Diagnosis not present

## 2019-11-28 DIAGNOSIS — Z20822 Contact with and (suspected) exposure to covid-19: Secondary | ICD-10-CM | POA: Diagnosis not present

## 2019-12-23 DIAGNOSIS — Z6828 Body mass index (BMI) 28.0-28.9, adult: Secondary | ICD-10-CM | POA: Diagnosis not present

## 2019-12-23 DIAGNOSIS — Z1331 Encounter for screening for depression: Secondary | ICD-10-CM | POA: Diagnosis not present

## 2019-12-23 DIAGNOSIS — E039 Hypothyroidism, unspecified: Secondary | ICD-10-CM | POA: Diagnosis not present

## 2019-12-23 DIAGNOSIS — Z0000 Encounter for general adult medical examination without abnormal findings: Secondary | ICD-10-CM | POA: Diagnosis not present

## 2020-01-05 DIAGNOSIS — Z23 Encounter for immunization: Secondary | ICD-10-CM | POA: Diagnosis not present

## 2020-02-02 DIAGNOSIS — Z23 Encounter for immunization: Secondary | ICD-10-CM | POA: Diagnosis not present

## 2020-04-28 DIAGNOSIS — H5203 Hypermetropia, bilateral: Secondary | ICD-10-CM | POA: Diagnosis not present

## 2020-06-04 DIAGNOSIS — Z01419 Encounter for gynecological examination (general) (routine) without abnormal findings: Secondary | ICD-10-CM | POA: Diagnosis not present

## 2020-06-04 DIAGNOSIS — Z6829 Body mass index (BMI) 29.0-29.9, adult: Secondary | ICD-10-CM | POA: Diagnosis not present

## 2020-06-15 DIAGNOSIS — E039 Hypothyroidism, unspecified: Secondary | ICD-10-CM | POA: Diagnosis not present

## 2020-06-28 DIAGNOSIS — E039 Hypothyroidism, unspecified: Secondary | ICD-10-CM | POA: Diagnosis not present

## 2020-07-09 DIAGNOSIS — Z23 Encounter for immunization: Secondary | ICD-10-CM | POA: Diagnosis not present

## 2020-08-25 DIAGNOSIS — E039 Hypothyroidism, unspecified: Secondary | ICD-10-CM | POA: Diagnosis not present

## 2020-12-02 DIAGNOSIS — Z20822 Contact with and (suspected) exposure to covid-19: Secondary | ICD-10-CM | POA: Diagnosis not present

## 2020-12-16 DIAGNOSIS — D485 Neoplasm of uncertain behavior of skin: Secondary | ICD-10-CM | POA: Diagnosis not present

## 2020-12-28 DIAGNOSIS — D2239 Melanocytic nevi of other parts of face: Secondary | ICD-10-CM | POA: Diagnosis not present

## 2020-12-28 DIAGNOSIS — D225 Melanocytic nevi of trunk: Secondary | ICD-10-CM | POA: Diagnosis not present

## 2020-12-28 DIAGNOSIS — L814 Other melanin hyperpigmentation: Secondary | ICD-10-CM | POA: Diagnosis not present

## 2021-02-05 DIAGNOSIS — J0300 Acute streptococcal tonsillitis, unspecified: Secondary | ICD-10-CM | POA: Diagnosis not present

## 2021-04-29 DIAGNOSIS — H5213 Myopia, bilateral: Secondary | ICD-10-CM | POA: Diagnosis not present

## 2021-04-29 DIAGNOSIS — H53143 Visual discomfort, bilateral: Secondary | ICD-10-CM | POA: Diagnosis not present

## 2021-06-07 DIAGNOSIS — Z113 Encounter for screening for infections with a predominantly sexual mode of transmission: Secondary | ICD-10-CM | POA: Diagnosis not present

## 2021-06-07 DIAGNOSIS — N898 Other specified noninflammatory disorders of vagina: Secondary | ICD-10-CM | POA: Diagnosis not present

## 2021-06-07 DIAGNOSIS — Z01419 Encounter for gynecological examination (general) (routine) without abnormal findings: Secondary | ICD-10-CM | POA: Diagnosis not present

## 2021-06-20 DIAGNOSIS — E039 Hypothyroidism, unspecified: Secondary | ICD-10-CM | POA: Diagnosis not present

## 2021-06-27 DIAGNOSIS — E039 Hypothyroidism, unspecified: Secondary | ICD-10-CM | POA: Diagnosis not present

## 2021-08-28 DIAGNOSIS — J020 Streptococcal pharyngitis: Secondary | ICD-10-CM | POA: Diagnosis not present

## 2021-09-18 DIAGNOSIS — B349 Viral infection, unspecified: Secondary | ICD-10-CM | POA: Diagnosis not present

## 2021-12-29 DIAGNOSIS — D225 Melanocytic nevi of trunk: Secondary | ICD-10-CM | POA: Diagnosis not present

## 2021-12-29 DIAGNOSIS — L814 Other melanin hyperpigmentation: Secondary | ICD-10-CM | POA: Diagnosis not present

## 2021-12-29 DIAGNOSIS — D485 Neoplasm of uncertain behavior of skin: Secondary | ICD-10-CM | POA: Diagnosis not present

## 2021-12-29 DIAGNOSIS — D2239 Melanocytic nevi of other parts of face: Secondary | ICD-10-CM | POA: Diagnosis not present

## 2022-03-07 DIAGNOSIS — Z1331 Encounter for screening for depression: Secondary | ICD-10-CM | POA: Diagnosis not present

## 2022-03-07 DIAGNOSIS — Z0000 Encounter for general adult medical examination without abnormal findings: Secondary | ICD-10-CM | POA: Diagnosis not present

## 2022-03-07 DIAGNOSIS — E039 Hypothyroidism, unspecified: Secondary | ICD-10-CM | POA: Diagnosis not present

## 2022-04-07 DIAGNOSIS — K5900 Constipation, unspecified: Secondary | ICD-10-CM | POA: Diagnosis not present

## 2022-04-11 DIAGNOSIS — R109 Unspecified abdominal pain: Secondary | ICD-10-CM | POA: Diagnosis not present

## 2022-04-11 DIAGNOSIS — K5900 Constipation, unspecified: Secondary | ICD-10-CM | POA: Diagnosis not present

## 2022-04-11 DIAGNOSIS — R110 Nausea: Secondary | ICD-10-CM | POA: Diagnosis not present

## 2022-04-13 DIAGNOSIS — R109 Unspecified abdominal pain: Secondary | ICD-10-CM | POA: Diagnosis not present

## 2022-04-13 DIAGNOSIS — K5900 Constipation, unspecified: Secondary | ICD-10-CM | POA: Diagnosis not present

## 2022-04-13 DIAGNOSIS — R110 Nausea: Secondary | ICD-10-CM | POA: Diagnosis not present

## 2022-04-13 DIAGNOSIS — Z882 Allergy status to sulfonamides status: Secondary | ICD-10-CM | POA: Diagnosis not present

## 2022-04-13 DIAGNOSIS — R140 Abdominal distension (gaseous): Secondary | ICD-10-CM | POA: Diagnosis not present

## 2022-04-14 DIAGNOSIS — K5900 Constipation, unspecified: Secondary | ICD-10-CM | POA: Diagnosis not present

## 2022-04-18 ENCOUNTER — Encounter: Payer: Self-pay | Admitting: Nurse Practitioner

## 2022-05-05 DIAGNOSIS — H5203 Hypermetropia, bilateral: Secondary | ICD-10-CM | POA: Diagnosis not present

## 2022-05-05 DIAGNOSIS — H53143 Visual discomfort, bilateral: Secondary | ICD-10-CM | POA: Diagnosis not present

## 2022-05-09 DIAGNOSIS — E079 Disorder of thyroid, unspecified: Secondary | ICD-10-CM | POA: Insufficient documentation

## 2022-05-11 ENCOUNTER — Ambulatory Visit: Payer: BC Managed Care – PPO | Admitting: Nurse Practitioner

## 2022-05-11 ENCOUNTER — Encounter: Payer: Self-pay | Admitting: Nurse Practitioner

## 2022-05-11 VITALS — BP 110/72 | HR 114 | Ht 66.0 in | Wt 169.0 lb

## 2022-05-11 DIAGNOSIS — K5900 Constipation, unspecified: Secondary | ICD-10-CM | POA: Diagnosis not present

## 2022-05-11 NOTE — Patient Instructions (Addendum)
If you are age 36 or younger, your body mass index should be between 19-25. Your Body mass index is 27.28 kg/m. If this is out of the aformentioned range listed, please consider follow up with your Primary Care Provider.  ________________________________________________________  The [facility] providers would like to encourage you to use [facility] to communicate with providers for non-urgent requests or questions.  Due to long hold times on the telephone, sending your provider a message by [facility] may be a faster and more efficient way to get a response.  Please allow 48 business hours for a response.  Please remember that this is for non-urgent requests.  _______________________________________________________  Follow up as needed.   Call us if you do develop any recurrent problems with nausea, abdominal pain, or constipation, or should you see blood in your stool.   Thank you for entrusting me with your care and choosing [facility].  Willette Cluster, NP-C

## 2022-05-11 NOTE — Progress Notes (Signed)
Chief Complaint:  recent constipation.    Assessment & Plan   # 36 yo female with recent generalized abdominal pain and nausea . Seen the end of June in an urgent care center.  Abdominal xray not available but patient was told she was constipated. Symptoms resolved after bowel purge but recurred a week later. Went to [facility] ED and xray showed only mild stool in colon. As instructed, she took miralax for a few days. Bowel movements have returned to normal. No longer requiring Miralax. Her labs in ED were reassuring. No FMH of colon cancer. No unexplained weight loss, blood in stool or other concerning features.  Since she is feeling back to normal we will hold off on further workup up.  Advised to call us if she has recurrent symptoms or develops new symptoms such as blood in stool. She is happy with this plan   HPI:    Carla Le is a very pleasant, healthy 36 y.o. year old female, new to the practice. She is self-referred for evaluation of recent constipation.  Past medical history significant for hypothyroidism  Carla Le was seen at an Urgent Care in [place], [place] on 04/07/22 for nausea and generalized abdominal cramping. Told she was severely constipated based on an xray.  Advised to take Miralax and if that didn't work then use Mg+ citrate. She took Miralax for 3 days without relief. She then took Mg+ citrate and had a good BM and felt better. Then a week later she had recurrent generalized abdominal cramping and nausea.  She went to [facility] emergency department on 04/14/2022 for evaluation. Record reviewed in Care Everywhere. She tells me that the pain was felt to be related to cramping from Mg+ citrate.  ED labs were reassuring.  X-ray of the abdomen showed a nonobstructive bowel gas pattern with mild stool burden.  She was advised to take MiraLAX and Metamucil daily for the next week and then slowly decrease the frequency.  Her bowel movements are back to normal. No further abdominal pain or nausea.  She hasn't required any miralax in a week. She hasn't had any blood in her stool. No FMH of colon cancer.   She cannot really attribute the acute constipation to anything. She had not changed her diet. She doesn't take medications / supplements. She tries to stay hydrated.    ED labs / imaging:  CMET unremarkable.  Preg test negative.  TSH normal at 1.2 UA - positive ketones, few bacteria. Nitrite negative.  KUB - Nonobstructive bowel gas pattern. Mild stool burden.   Past Medical History:  Diagnosis Date   Abnormal Pap smear    Hypothyroidism    Vasa previa    Past Surgical History:  Procedure Laterality Date   CESAREAN SECTION N/A 12/27/2017   Procedure: CESAREAN SECTION;  Surgeon: Waynard Reeds, MD;  Location: [facility]IR[facility]ING SUITES;  Service: Obstetrics;  Laterality: N/A;   WISDOM TOOTH EXTRACTION     Family History  Problem Relation Age of Onset   Hypertension Father    Heart disease Father    Heart attack Father    Social History   Tobacco Use   Smoking status: Never   Smokeless tobacco: Never  Substance Use Topics   Alcohol use: No   Drug use: No   Current Outpatient Medications  Medication Sig Dispense Refill   levonorgestrel (MIRENA, 52 MG,) 20 MCG/24HR IUD Mirena 20 mcg/24 hours (5 yrs) 52 mg intrauterine device  Take 1 device by intrauterine route.  levothyroxine (SYNTHROID) 112 MCG tablet 1 tablet in the morning on an empty stomach     No current facility-administered medications for this visit.   Allergies  Allergen Reactions   Sulfa Antibiotics Hives and Rash    Other reaction(s): hives     Review of Systems: Positive for backache when constipated.  All other systems reviewed and negative except where noted in HPI.   Wt Readings from Last 3 Encounters:  07/22/18 172 lb 1.3 oz (78.1 kg)  12/27/17 197 lb 12.8 oz (89.7 kg)  12/19/17 195 lb (88.5 kg)    Physical Exam   BP 110/72   Pulse (!) 114   Ht 5\' 6"  (1.676 m)   Wt 169 lb (76.7 kg)    BMI 27.28 kg/m  Constitutional:  Generally well appearing female in no acute distress. Psychiatric: Pleasant. Normal mood and affect. Behavior is normal. EENT: Pupils normal.  Conjunctivae are normal. No scleral icterus. Neck supple.  Cardiovascular: Normal rate, regular rhythm. No edema Pulmonary/chest: Effort normal and breath sounds normal. No wheezing, rales or rhonchi. Abdominal: Soft, nondistended, nontender. Bowel sounds active throughout. There are no masses palpable. No hepatomegaly. Neurological: Alert and oriented to person place and time. Skin: Skin is warm and dry. No rashes noted.  , NP  05/11/2022, 2:23 PM

## 2022-05-28 NOTE — Progress Notes (Signed)
Agree with assessment/plan.  Raj Lessly Stigler, MD [facility] [phone]  

## 2022-06-20 DIAGNOSIS — E039 Hypothyroidism, unspecified: Secondary | ICD-10-CM | POA: Diagnosis not present

## 2022-06-27 DIAGNOSIS — E039 Hypothyroidism, unspecified: Secondary | ICD-10-CM | POA: Diagnosis not present

## 2022-07-05 DIAGNOSIS — Z30431 Encounter for routine checking of intrauterine contraceptive device: Secondary | ICD-10-CM | POA: Diagnosis not present

## 2022-07-05 DIAGNOSIS — N921 Excessive and frequent menstruation with irregular cycle: Secondary | ICD-10-CM | POA: Diagnosis not present

## 2022-07-05 DIAGNOSIS — Z6827 Body mass index (BMI) 27.0-27.9, adult: Secondary | ICD-10-CM | POA: Diagnosis not present

## 2022-08-14 DIAGNOSIS — S63502A Unspecified sprain of left wrist, initial encounter: Secondary | ICD-10-CM | POA: Diagnosis not present

## 2023-01-11 DIAGNOSIS — D2239 Melanocytic nevi of other parts of face: Secondary | ICD-10-CM | POA: Diagnosis not present

## 2023-01-11 DIAGNOSIS — L814 Other melanin hyperpigmentation: Secondary | ICD-10-CM | POA: Diagnosis not present

## 2023-01-11 DIAGNOSIS — L578 Other skin changes due to chronic exposure to nonionizing radiation: Secondary | ICD-10-CM | POA: Diagnosis not present

## 2023-01-11 DIAGNOSIS — D485 Neoplasm of uncertain behavior of skin: Secondary | ICD-10-CM | POA: Diagnosis not present

## 2023-01-11 DIAGNOSIS — D225 Melanocytic nevi of trunk: Secondary | ICD-10-CM | POA: Diagnosis not present

## 2023-02-06 DIAGNOSIS — J029 Acute pharyngitis, unspecified: Secondary | ICD-10-CM | POA: Diagnosis not present

## 2023-02-06 DIAGNOSIS — Z20822 Contact with and (suspected) exposure to covid-19: Secondary | ICD-10-CM | POA: Diagnosis not present

## 2023-05-09 DIAGNOSIS — H5200 Hypermetropia, unspecified eye: Secondary | ICD-10-CM | POA: Diagnosis not present

## 2023-06-28 DIAGNOSIS — E039 Hypothyroidism, unspecified: Secondary | ICD-10-CM | POA: Diagnosis not present

## 2023-07-12 DIAGNOSIS — E039 Hypothyroidism, unspecified: Secondary | ICD-10-CM | POA: Diagnosis not present

## 2023-08-23 DIAGNOSIS — E039 Hypothyroidism, unspecified: Secondary | ICD-10-CM | POA: Diagnosis not present

## 2023-11-12 DIAGNOSIS — Z20822 Contact with and (suspected) exposure to covid-19: Secondary | ICD-10-CM | POA: Diagnosis not present

## 2023-11-12 DIAGNOSIS — R051 Acute cough: Secondary | ICD-10-CM | POA: Diagnosis not present

## 2023-11-12 DIAGNOSIS — R509 Fever, unspecified: Secondary | ICD-10-CM | POA: Diagnosis not present

## 2024-01-16 DIAGNOSIS — D2239 Melanocytic nevi of other parts of face: Secondary | ICD-10-CM | POA: Diagnosis not present

## 2024-01-16 DIAGNOSIS — L814 Other melanin hyperpigmentation: Secondary | ICD-10-CM | POA: Diagnosis not present

## 2024-01-16 DIAGNOSIS — L578 Other skin changes due to chronic exposure to nonionizing radiation: Secondary | ICD-10-CM | POA: Diagnosis not present

## 2024-01-16 DIAGNOSIS — D485 Neoplasm of uncertain behavior of skin: Secondary | ICD-10-CM | POA: Diagnosis not present

## 2024-01-16 DIAGNOSIS — D225 Melanocytic nevi of trunk: Secondary | ICD-10-CM | POA: Diagnosis not present

## 2024-05-19 DIAGNOSIS — H5203 Hypermetropia, bilateral: Secondary | ICD-10-CM | POA: Diagnosis not present

## 2024-06-11 DIAGNOSIS — M9900 Segmental and somatic dysfunction of head region: Secondary | ICD-10-CM | POA: Diagnosis not present

## 2024-06-11 DIAGNOSIS — M461 Sacroiliitis, not elsewhere classified: Secondary | ICD-10-CM | POA: Diagnosis not present

## 2024-06-11 DIAGNOSIS — M609 Myositis, unspecified: Secondary | ICD-10-CM | POA: Diagnosis not present

## 2024-06-11 DIAGNOSIS — M9908 Segmental and somatic dysfunction of rib cage: Secondary | ICD-10-CM | POA: Diagnosis not present

## 2024-06-11 DIAGNOSIS — M9902 Segmental and somatic dysfunction of thoracic region: Secondary | ICD-10-CM | POA: Diagnosis not present

## 2024-06-11 DIAGNOSIS — M5124 Other intervertebral disc displacement, thoracic region: Secondary | ICD-10-CM | POA: Diagnosis not present

## 2024-06-12 DIAGNOSIS — M461 Sacroiliitis, not elsewhere classified: Secondary | ICD-10-CM | POA: Diagnosis not present

## 2024-06-12 DIAGNOSIS — M609 Myositis, unspecified: Secondary | ICD-10-CM | POA: Diagnosis not present

## 2024-06-12 DIAGNOSIS — M5124 Other intervertebral disc displacement, thoracic region: Secondary | ICD-10-CM | POA: Diagnosis not present

## 2024-06-12 DIAGNOSIS — M9902 Segmental and somatic dysfunction of thoracic region: Secondary | ICD-10-CM | POA: Diagnosis not present

## 2024-06-12 DIAGNOSIS — M9908 Segmental and somatic dysfunction of rib cage: Secondary | ICD-10-CM | POA: Diagnosis not present

## 2024-06-12 DIAGNOSIS — M9900 Segmental and somatic dysfunction of head region: Secondary | ICD-10-CM | POA: Diagnosis not present

## 2024-06-16 DIAGNOSIS — M461 Sacroiliitis, not elsewhere classified: Secondary | ICD-10-CM | POA: Diagnosis not present

## 2024-06-16 DIAGNOSIS — M5124 Other intervertebral disc displacement, thoracic region: Secondary | ICD-10-CM | POA: Diagnosis not present

## 2024-06-16 DIAGNOSIS — M609 Myositis, unspecified: Secondary | ICD-10-CM | POA: Diagnosis not present

## 2024-06-16 DIAGNOSIS — M9902 Segmental and somatic dysfunction of thoracic region: Secondary | ICD-10-CM | POA: Diagnosis not present

## 2024-06-16 DIAGNOSIS — M9900 Segmental and somatic dysfunction of head region: Secondary | ICD-10-CM | POA: Diagnosis not present

## 2024-06-16 DIAGNOSIS — M9908 Segmental and somatic dysfunction of rib cage: Secondary | ICD-10-CM | POA: Diagnosis not present

## 2024-06-18 DIAGNOSIS — M9902 Segmental and somatic dysfunction of thoracic region: Secondary | ICD-10-CM | POA: Diagnosis not present

## 2024-06-18 DIAGNOSIS — M9908 Segmental and somatic dysfunction of rib cage: Secondary | ICD-10-CM | POA: Diagnosis not present

## 2024-06-18 DIAGNOSIS — M609 Myositis, unspecified: Secondary | ICD-10-CM | POA: Diagnosis not present

## 2024-06-18 DIAGNOSIS — M461 Sacroiliitis, not elsewhere classified: Secondary | ICD-10-CM | POA: Diagnosis not present

## 2024-06-18 DIAGNOSIS — M9900 Segmental and somatic dysfunction of head region: Secondary | ICD-10-CM | POA: Diagnosis not present

## 2024-06-18 DIAGNOSIS — M5124 Other intervertebral disc displacement, thoracic region: Secondary | ICD-10-CM | POA: Diagnosis not present

## 2024-06-23 DIAGNOSIS — M609 Myositis, unspecified: Secondary | ICD-10-CM | POA: Diagnosis not present

## 2024-06-23 DIAGNOSIS — M461 Sacroiliitis, not elsewhere classified: Secondary | ICD-10-CM | POA: Diagnosis not present

## 2024-06-23 DIAGNOSIS — M5124 Other intervertebral disc displacement, thoracic region: Secondary | ICD-10-CM | POA: Diagnosis not present

## 2024-06-23 DIAGNOSIS — M9900 Segmental and somatic dysfunction of head region: Secondary | ICD-10-CM | POA: Diagnosis not present

## 2024-06-23 DIAGNOSIS — M9908 Segmental and somatic dysfunction of rib cage: Secondary | ICD-10-CM | POA: Diagnosis not present

## 2024-06-23 DIAGNOSIS — M9902 Segmental and somatic dysfunction of thoracic region: Secondary | ICD-10-CM | POA: Diagnosis not present

## 2024-06-30 DIAGNOSIS — M5124 Other intervertebral disc displacement, thoracic region: Secondary | ICD-10-CM | POA: Diagnosis not present

## 2024-06-30 DIAGNOSIS — M9908 Segmental and somatic dysfunction of rib cage: Secondary | ICD-10-CM | POA: Diagnosis not present

## 2024-06-30 DIAGNOSIS — M609 Myositis, unspecified: Secondary | ICD-10-CM | POA: Diagnosis not present

## 2024-06-30 DIAGNOSIS — M461 Sacroiliitis, not elsewhere classified: Secondary | ICD-10-CM | POA: Diagnosis not present

## 2024-06-30 DIAGNOSIS — M9900 Segmental and somatic dysfunction of head region: Secondary | ICD-10-CM | POA: Diagnosis not present

## 2024-06-30 DIAGNOSIS — M9902 Segmental and somatic dysfunction of thoracic region: Secondary | ICD-10-CM | POA: Diagnosis not present

## 2024-07-07 DIAGNOSIS — M609 Myositis, unspecified: Secondary | ICD-10-CM | POA: Diagnosis not present

## 2024-07-07 DIAGNOSIS — M9900 Segmental and somatic dysfunction of head region: Secondary | ICD-10-CM | POA: Diagnosis not present

## 2024-07-07 DIAGNOSIS — M9908 Segmental and somatic dysfunction of rib cage: Secondary | ICD-10-CM | POA: Diagnosis not present

## 2024-07-07 DIAGNOSIS — M5124 Other intervertebral disc displacement, thoracic region: Secondary | ICD-10-CM | POA: Diagnosis not present

## 2024-07-07 DIAGNOSIS — M9902 Segmental and somatic dysfunction of thoracic region: Secondary | ICD-10-CM | POA: Diagnosis not present

## 2024-07-07 DIAGNOSIS — M461 Sacroiliitis, not elsewhere classified: Secondary | ICD-10-CM | POA: Diagnosis not present

## 2024-07-08 DIAGNOSIS — E039 Hypothyroidism, unspecified: Secondary | ICD-10-CM | POA: Diagnosis not present

## 2024-07-14 DIAGNOSIS — M609 Myositis, unspecified: Secondary | ICD-10-CM | POA: Diagnosis not present

## 2024-07-14 DIAGNOSIS — M9908 Segmental and somatic dysfunction of rib cage: Secondary | ICD-10-CM | POA: Diagnosis not present

## 2024-07-14 DIAGNOSIS — M9900 Segmental and somatic dysfunction of head region: Secondary | ICD-10-CM | POA: Diagnosis not present

## 2024-07-14 DIAGNOSIS — M461 Sacroiliitis, not elsewhere classified: Secondary | ICD-10-CM | POA: Diagnosis not present

## 2024-07-14 DIAGNOSIS — M5124 Other intervertebral disc displacement, thoracic region: Secondary | ICD-10-CM | POA: Diagnosis not present

## 2024-07-14 DIAGNOSIS — M9902 Segmental and somatic dysfunction of thoracic region: Secondary | ICD-10-CM | POA: Diagnosis not present

## 2024-07-15 DIAGNOSIS — Z23 Encounter for immunization: Secondary | ICD-10-CM | POA: Diagnosis not present

## 2024-07-15 DIAGNOSIS — E039 Hypothyroidism, unspecified: Secondary | ICD-10-CM | POA: Diagnosis not present

## 2024-07-24 DIAGNOSIS — M461 Sacroiliitis, not elsewhere classified: Secondary | ICD-10-CM | POA: Diagnosis not present

## 2024-07-24 DIAGNOSIS — M9900 Segmental and somatic dysfunction of head region: Secondary | ICD-10-CM | POA: Diagnosis not present

## 2024-07-24 DIAGNOSIS — M5124 Other intervertebral disc displacement, thoracic region: Secondary | ICD-10-CM | POA: Diagnosis not present

## 2024-07-24 DIAGNOSIS — M9902 Segmental and somatic dysfunction of thoracic region: Secondary | ICD-10-CM | POA: Diagnosis not present

## 2024-07-24 DIAGNOSIS — M609 Myositis, unspecified: Secondary | ICD-10-CM | POA: Diagnosis not present

## 2024-07-24 DIAGNOSIS — M9908 Segmental and somatic dysfunction of rib cage: Secondary | ICD-10-CM | POA: Diagnosis not present

## 2024-07-28 DIAGNOSIS — M9902 Segmental and somatic dysfunction of thoracic region: Secondary | ICD-10-CM | POA: Diagnosis not present

## 2024-07-28 DIAGNOSIS — M9908 Segmental and somatic dysfunction of rib cage: Secondary | ICD-10-CM | POA: Diagnosis not present

## 2024-07-28 DIAGNOSIS — M5124 Other intervertebral disc displacement, thoracic region: Secondary | ICD-10-CM | POA: Diagnosis not present

## 2024-07-28 DIAGNOSIS — M609 Myositis, unspecified: Secondary | ICD-10-CM | POA: Diagnosis not present

## 2024-07-28 DIAGNOSIS — M9900 Segmental and somatic dysfunction of head region: Secondary | ICD-10-CM | POA: Diagnosis not present

## 2024-07-28 DIAGNOSIS — M461 Sacroiliitis, not elsewhere classified: Secondary | ICD-10-CM | POA: Diagnosis not present

## 2024-08-04 DIAGNOSIS — M9900 Segmental and somatic dysfunction of head region: Secondary | ICD-10-CM | POA: Diagnosis not present

## 2024-08-04 DIAGNOSIS — M609 Myositis, unspecified: Secondary | ICD-10-CM | POA: Diagnosis not present

## 2024-08-04 DIAGNOSIS — M461 Sacroiliitis, not elsewhere classified: Secondary | ICD-10-CM | POA: Diagnosis not present

## 2024-08-04 DIAGNOSIS — M5124 Other intervertebral disc displacement, thoracic region: Secondary | ICD-10-CM | POA: Diagnosis not present

## 2024-08-04 DIAGNOSIS — M9902 Segmental and somatic dysfunction of thoracic region: Secondary | ICD-10-CM | POA: Diagnosis not present

## 2024-08-04 DIAGNOSIS — M9908 Segmental and somatic dysfunction of rib cage: Secondary | ICD-10-CM | POA: Diagnosis not present

## 2024-08-11 DIAGNOSIS — M5124 Other intervertebral disc displacement, thoracic region: Secondary | ICD-10-CM | POA: Diagnosis not present

## 2024-08-11 DIAGNOSIS — M461 Sacroiliitis, not elsewhere classified: Secondary | ICD-10-CM | POA: Diagnosis not present

## 2024-08-11 DIAGNOSIS — M9902 Segmental and somatic dysfunction of thoracic region: Secondary | ICD-10-CM | POA: Diagnosis not present

## 2024-08-11 DIAGNOSIS — M9900 Segmental and somatic dysfunction of head region: Secondary | ICD-10-CM | POA: Diagnosis not present

## 2024-08-11 DIAGNOSIS — M609 Myositis, unspecified: Secondary | ICD-10-CM | POA: Diagnosis not present

## 2024-08-11 DIAGNOSIS — M9908 Segmental and somatic dysfunction of rib cage: Secondary | ICD-10-CM | POA: Diagnosis not present

## 2024-08-25 DIAGNOSIS — M9908 Segmental and somatic dysfunction of rib cage: Secondary | ICD-10-CM | POA: Diagnosis not present

## 2024-08-25 DIAGNOSIS — M9900 Segmental and somatic dysfunction of head region: Secondary | ICD-10-CM | POA: Diagnosis not present

## 2024-08-25 DIAGNOSIS — M609 Myositis, unspecified: Secondary | ICD-10-CM | POA: Diagnosis not present

## 2024-08-25 DIAGNOSIS — M461 Sacroiliitis, not elsewhere classified: Secondary | ICD-10-CM | POA: Diagnosis not present

## 2024-08-25 DIAGNOSIS — M9902 Segmental and somatic dysfunction of thoracic region: Secondary | ICD-10-CM | POA: Diagnosis not present

## 2024-08-25 DIAGNOSIS — M5124 Other intervertebral disc displacement, thoracic region: Secondary | ICD-10-CM | POA: Diagnosis not present

## 2024-09-10 DIAGNOSIS — M609 Myositis, unspecified: Secondary | ICD-10-CM | POA: Diagnosis not present

## 2024-09-10 DIAGNOSIS — M9900 Segmental and somatic dysfunction of head region: Secondary | ICD-10-CM | POA: Diagnosis not present

## 2024-09-10 DIAGNOSIS — M9908 Segmental and somatic dysfunction of rib cage: Secondary | ICD-10-CM | POA: Diagnosis not present

## 2024-09-10 DIAGNOSIS — M461 Sacroiliitis, not elsewhere classified: Secondary | ICD-10-CM | POA: Diagnosis not present

## 2024-09-10 DIAGNOSIS — M5124 Other intervertebral disc displacement, thoracic region: Secondary | ICD-10-CM | POA: Diagnosis not present

## 2024-09-10 DIAGNOSIS — M9902 Segmental and somatic dysfunction of thoracic region: Secondary | ICD-10-CM | POA: Diagnosis not present

## 2024-09-22 DIAGNOSIS — M9900 Segmental and somatic dysfunction of head region: Secondary | ICD-10-CM | POA: Diagnosis not present

## 2024-09-22 DIAGNOSIS — M9908 Segmental and somatic dysfunction of rib cage: Secondary | ICD-10-CM | POA: Diagnosis not present

## 2024-09-22 DIAGNOSIS — M5124 Other intervertebral disc displacement, thoracic region: Secondary | ICD-10-CM | POA: Diagnosis not present

## 2024-09-22 DIAGNOSIS — M609 Myositis, unspecified: Secondary | ICD-10-CM | POA: Diagnosis not present

## 2024-09-22 DIAGNOSIS — M461 Sacroiliitis, not elsewhere classified: Secondary | ICD-10-CM | POA: Diagnosis not present

## 2024-09-22 DIAGNOSIS — M9902 Segmental and somatic dysfunction of thoracic region: Secondary | ICD-10-CM | POA: Diagnosis not present

## 2024-10-06 DIAGNOSIS — M5124 Other intervertebral disc displacement, thoracic region: Secondary | ICD-10-CM | POA: Diagnosis not present

## 2024-10-06 DIAGNOSIS — M9902 Segmental and somatic dysfunction of thoracic region: Secondary | ICD-10-CM | POA: Diagnosis not present

## 2024-10-06 DIAGNOSIS — M461 Sacroiliitis, not elsewhere classified: Secondary | ICD-10-CM | POA: Diagnosis not present

## 2024-10-06 DIAGNOSIS — M609 Myositis, unspecified: Secondary | ICD-10-CM | POA: Diagnosis not present
